# Patient Record
Sex: Female | Born: 1975
Health system: Southern US, Community
[De-identification: ages and names within clinical notes are randomized; demographics above are authoritative.]

## PROBLEM LIST (undated history)

## (undated) DIAGNOSIS — A048 Other specified bacterial intestinal infections: Secondary | ICD-10-CM

## (undated) HISTORY — PX: NO PAST SURGERIES: SHX2092

## (undated) HISTORY — DX: Other specified bacterial intestinal infections: A04.8

---

## 2014-06-02 ENCOUNTER — Ambulatory Visit: Payer: Medicaid Other | Attending: Family Medicine | Admitting: Family Medicine

## 2014-06-02 ENCOUNTER — Encounter: Payer: Self-pay | Admitting: Family Medicine

## 2014-06-02 VITALS — BP 133/84 | HR 68 | Temp 97.9°F | Resp 16 | Ht <= 58 in | Wt 117.0 lb

## 2014-06-02 DIAGNOSIS — B9681 Helicobacter pylori [H. pylori] as the cause of diseases classified elsewhere: Secondary | ICD-10-CM

## 2014-06-02 DIAGNOSIS — R1013 Epigastric pain: Secondary | ICD-10-CM | POA: Diagnosis not present

## 2014-06-02 DIAGNOSIS — Z113 Encounter for screening for infections with a predominantly sexual mode of transmission: Secondary | ICD-10-CM

## 2014-06-02 DIAGNOSIS — A048 Other specified bacterial intestinal infections: Secondary | ICD-10-CM

## 2014-06-02 DIAGNOSIS — Z0000 Encounter for general adult medical examination without abnormal findings: Secondary | ICD-10-CM

## 2014-06-02 DIAGNOSIS — G8929 Other chronic pain: Secondary | ICD-10-CM

## 2014-06-02 DIAGNOSIS — IMO0001 Reserved for inherently not codable concepts without codable children: Secondary | ICD-10-CM

## 2014-06-02 LAB — CBC
HEMATOCRIT: 38.3 % (ref 36.0–46.0)
Hemoglobin: 12.7 g/dL (ref 12.0–15.0)
MCH: 27 pg (ref 26.0–34.0)
MCHC: 33.2 g/dL (ref 30.0–36.0)
MCV: 81.5 fL (ref 78.0–100.0)
MPV: 11.1 fL (ref 8.6–12.4)
Platelets: 190 10*3/uL (ref 150–400)
RBC: 4.7 MIL/uL (ref 3.87–5.11)
RDW: 15.6 % — ABNORMAL HIGH (ref 11.5–15.5)
WBC: 6.3 10*3/uL (ref 4.0–10.5)

## 2014-06-02 LAB — POCT URINALYSIS DIPSTICK
BILIRUBIN UA: NEGATIVE
Glucose, UA: NEGATIVE
KETONES UA: NEGATIVE
Leukocytes, UA: NEGATIVE
Nitrite, UA: NEGATIVE
Protein, UA: NEGATIVE
Spec Grav, UA: 1.01
Urobilinogen, UA: 0.2
pH, UA: 6.5

## 2014-06-02 LAB — COMPLETE METABOLIC PANEL WITH GFR
ALT: 30 U/L (ref 0–35)
AST: 29 U/L (ref 0–37)
Albumin: 3.8 g/dL (ref 3.5–5.2)
Alkaline Phosphatase: 52 U/L (ref 39–117)
BILIRUBIN TOTAL: 0.3 mg/dL (ref 0.2–1.2)
BUN: 14 mg/dL (ref 6–23)
CALCIUM: 9.1 mg/dL (ref 8.4–10.5)
CO2: 27 meq/L (ref 19–32)
CREATININE: 0.63 mg/dL (ref 0.50–1.10)
Chloride: 100 mEq/L (ref 96–112)
GFR, Est African American: >89 mL/min
GFR, Est Non African American: >89 mL/min
GLUCOSE: 89 mg/dL (ref 70–99)
Potassium: 4.5 mEq/L (ref 3.5–5.3)
SODIUM: 134 meq/L — AB (ref 135–145)
TOTAL PROTEIN: 7.7 g/dL (ref 6.0–8.3)

## 2014-06-02 LAB — LIPID PANEL
CHOL/HDL RATIO: 2.4 ratio
Cholesterol: 149 mg/dL (ref 0–200)
HDL: 61 mg/dL (ref >39)
LDL CALC: 58 mg/dL (ref 0–99)
TRIGLYCERIDES: 150 mg/dL — AB (ref <150)
VLDL: 30 mg/dL (ref 0–40)

## 2014-06-02 MED ORDER — PANTOPRAZOLE SODIUM 40 MG PO TBEC: 40.0000 mg | DELAYED_RELEASE_TABLET | Freq: Every day | ORAL | Status: DC

## 2014-06-02 NOTE — Patient Instructions (Signed)
Hailey Chambers  Thank you for coming in today. It was a pleasure meeting you. I look forward to being your primary doctor.   1. GERD: Start protonix 30 minutes before supper.  You will be called with lab results, including test results for H. Pylori.    F/u in 1 month for GERD follow up and pap smear.   Dr. Armen PickupFunches    Gastroesophageal Reflux Disease, Adult Gastroesophageal reflux disease (GERD) happens when acid from your stomach goes into your food pipe (esophagus). The acid can cause a burning feeling in your chest. Over time, the acid can make small holes (ulcers) in your food pipe.  HOME CARE  Ask your doctor for advice about:  Losing weight.  Quitting smoking.  Alcohol use.  Avoid foods and drinks that make your problems worse. You may want to avoid:  Caffeine and alcohol.  Chocolate.  Mints.  Garlic and onions.  Spicy foods.  Citrus fruits, such as oranges, lemons, or limes.  Foods that contain tomato, such as sauce, chili, salsa, and pizza.  Fried and fatty foods.  Avoid lying down for 3 hours before you go to bed or before you take a nap.  Eat small meals often, instead of large meals.  Wear loose-fitting clothing. Do not wear anything tight around your waist.  Raise (elevate) the head of your bed 6 to 8 inches with wood blocks. Using extra pillows does not help.  Only take medicines as told by your doctor.  Do not take aspirin or ibuprofen. GET HELP RIGHT AWAY IF:   You have pain in your arms, neck, jaw, teeth, or back.  Your pain gets worse or changes.  You feel sick to your stomach (nauseous), throw up (vomit), or sweat (diaphoresis).  You feel short of breath, or you pass out (faint).  Your throw up is green, yellow, black, or looks like coffee grounds or blood.  Your poop (stool) is red, bloody, or black. MAKE SURE YOU:   Understand these instructions.  Will watch your condition.  Will get help right away if you are not doing  well or get worse. Document Released: 10/09/2007 Document Revised: 07/15/2011 Document Reviewed: 11/09/2010 [facility] Patient Information 2015 [place], [place]. This information is not intended to replace advice given to you by your health care provider. Make sure you discuss any questions you have with your health care provider.

## 2014-06-02 NOTE — Progress Notes (Signed)
Pt comes in with c/o epigastric intermit pain x 6 months Pt describes the pain as burning sensation radiating to left upper quad Denies n/v/d- Abdomen distended Pt from [place]- moved to [place] 3 mnths ago LMP- 05/27/14 No medical history reported In house Swahili interpretor present  Urine dipstick obtained

## 2014-06-02 NOTE — Progress Notes (Signed)
   Subjective:    Patient ID: Hailey Chambers, female    DOB: 06-01-1975, 39 y.o.   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 Epigastric pain: x 6 months. Worsened over past 3 months. [place] not [place] with what. Admits to burning pain. Sour taste in mouth. No CP, SOB, weight loss, N/V.   Soc Hx: non smoker Med Hx: GERD treated in the past  Surg Hx: negative  Review of Systems As per HPI     Objective:   Physical Exam BP 133/84 mmHg  Pulse 68  Temp(Src) 97.9 F (36.6 C) (Oral)  Resp 16  Ht 4\' 8"  (1.422 m)  Wt 117 lb (53.071 kg)  BMI 26.25 kg/m2  SpO2 98%  LMP 05/27/2014 General appearance: alert, cooperative and no distress Throat: lips, mucosa, and tongue normal; teeth and gums normal Neck: no adenopathy, supple, symmetrical, trachea midline and thyroid not enlarged, symmetric, no tenderness/mass/nodules Lungs: clear to auscultation bilaterally Heart: regular rate and rhythm, S1, S2 normal, no murmur, click, rub or gallop Abdomen: soft, non-tender; bowel sounds normal; no masses,  no organomegaly   UA: normal     Assessment & Plan:

## 2014-06-03 ENCOUNTER — Telehealth: Payer: Self-pay | Admitting: *Deleted

## 2014-06-03 DIAGNOSIS — A048 Other specified bacterial intestinal infections: Secondary | ICD-10-CM

## 2014-06-03 HISTORY — DX: Other specified bacterial intestinal infections: A04.8

## 2014-06-03 LAB — HIV ANTIBODY (ROUTINE TESTING W REFLEX): HIV 1&2 Ab, 4th Generation: NONREACTIVE

## 2014-06-03 LAB — H. PYLORI BREATH TEST: H. PYLORI BREATH TEST: DETECTED — AB

## 2014-06-03 MED ORDER — CLARITHROMYCIN 500 MG PO TABS: 500.0000 mg | ORAL_TABLET | Freq: Two times a day (BID) | ORAL | Status: DC

## 2014-06-03 MED ORDER — AMOXICILLIN 500 MG PO CAPS: 1000.0000 mg | ORAL_CAPSULE | Freq: Two times a day (BID) | ORAL | Status: DC

## 2014-06-03 NOTE — Assessment & Plan Note (Addendum)
Positive breath test  Will treat

## 2014-06-03 NOTE — Assessment & Plan Note (Signed)
1. GERD suspect, urea breath test done.  P: Start protonix 30 minutes before supper.  You will be called with lab results, including test results for H. Pylori.   Urea breath test positive, will add amoxicillin and clarithromycin x 10 days course = triple therapy

## 2014-06-03 NOTE — Telephone Encounter (Signed)
Used [employer] [place] [phone]#[phone]

## 2014-06-03 NOTE — Telephone Encounter (Signed)
-----   Message from Lora PaulaJosalyn C Funches, MD sent at 06/03/2014  4:05 PM EST ----- Urea breath test is positive, patient to start 10 days of antibiotic therapy with protonix sent to pharmacy amoxicillin 1000 mg BID and clarithromycin 500 mg BID.  Screening HIV negative. Normal CBC, CMP, lipids

## 2014-06-03 NOTE — Telephone Encounter (Signed)
Pt aware of lab results [facility]  Used [employer] [phone]#[id number]

## 2014-06-03 NOTE — Assessment & Plan Note (Signed)
Screening HIV negative  

## 2014-08-02 ENCOUNTER — Telehealth: Payer: Self-pay | Admitting: *Deleted

## 2014-08-02 NOTE — Telephone Encounter (Signed)
Immunization added Used [employer] [place]# [phone]  Left message with husband Stated will stop by tomorrow with wife

## 2014-08-02 NOTE — Telephone Encounter (Signed)
-----   Message from Dessa PhiJosalyn Funches, MD sent at 08/02/2014  8:44 AM EDT ----- Regarding: Immunizations  Jenna Ardoin, please add immunizations into patient's chart under immunizations.   Also, please call patient with reminders.  Due for second Hep B on 08/03/14 or later Due for Td on 11/12/14

## 2014-09-22 ENCOUNTER — Ambulatory Visit: Payer: Medicaid Other | Attending: Family Medicine | Admitting: Family Medicine

## 2014-09-22 ENCOUNTER — Encounter: Payer: Self-pay | Admitting: Family Medicine

## 2014-09-22 VITALS — BP 153/82 | HR 65 | Temp 97.6°F | Resp 16 | Ht <= 58 in | Wt 119.0 lb

## 2014-09-22 DIAGNOSIS — G8929 Other chronic pain: Secondary | ICD-10-CM

## 2014-09-22 DIAGNOSIS — N926 Irregular menstruation, unspecified: Secondary | ICD-10-CM | POA: Diagnosis not present

## 2014-09-22 DIAGNOSIS — B9681 Helicobacter pylori [H. pylori] as the cause of diseases classified elsewhere: Secondary | ICD-10-CM | POA: Insufficient documentation

## 2014-09-22 DIAGNOSIS — R030 Elevated blood-pressure reading, without diagnosis of hypertension: Secondary | ICD-10-CM | POA: Diagnosis not present

## 2014-09-22 DIAGNOSIS — R1013 Epigastric pain: Secondary | ICD-10-CM

## 2014-09-22 DIAGNOSIS — IMO0001 Reserved for inherently not codable concepts without codable children: Secondary | ICD-10-CM

## 2014-09-22 DIAGNOSIS — Z3202 Encounter for pregnancy test, result negative: Secondary | ICD-10-CM | POA: Insufficient documentation

## 2014-09-22 LAB — POCT URINE PREGNANCY: PREG TEST UR: NEGATIVE

## 2014-09-22 MED ORDER — PANTOPRAZOLE SODIUM 40 MG PO TBEC: 40.0000 mg | DELAYED_RELEASE_TABLET | Freq: Every day | ORAL | Status: DC

## 2014-09-22 NOTE — Assessment & Plan Note (Signed)
Missed period: U preg negative today

## 2014-09-22 NOTE — Progress Notes (Signed)
   Subjective:    Patient ID: Hailey Chambers, female    DOB: Mar 29, 1976, 39 y.o.   MRN: [id number] CC: f/u epigastric pain du to H pylori  HPI  1. Epigastric pain related to H. Pylori:  Pain has improved. Now intermittent only. No nausea or emesis. Patient has completed triple therapy. No longer taking PPI.   2. Missed period: missed period x 1 month. No history of skipped menses. No sweats or weight loss.   3. Elevated BP:  Noted on today's exam. No hx of HTN.   Soc Hx: non smoker  Review of Systems  Constitutional: Negative for unexpected weight change.  Eyes: Negative for visual disturbance.  Respiratory: Negative for shortness of breath.   Cardiovascular: Negative for chest pain.  Neurological: Negative for headaches.      Objective:   Physical Exam BP 153/82 mmHg  Pulse 65  Temp(Src) 97.6 F (36.4 C) (Oral)  Resp 16  Ht 4' 7.5" (1.41 m)  Wt 119 lb (53.978 kg)  BMI 27.15 kg/m2  SpO2 100%  LMP 08/01/2014  Wt Readings from Last 3 Encounters:  09/22/14 119 lb (53.978 kg)  06/02/14 117 lb (53.071 kg)    BP Readings from Last 3 Encounters:  09/22/14 159/82  06/02/14 133/84  General appearance: alert, cooperative and no distress Lungs: clear to auscultation bilaterally Heart: regular rate and rhythm, S1, S2 normal, no murmur, click, rub or gallop  U preg: negative      Assessment & Plan:

## 2014-09-22 NOTE — Progress Notes (Signed)
F/U Stomach problems Stated feeling better  No NVD

## 2014-09-22 NOTE — Assessment & Plan Note (Signed)
Elevated BP today: Start DASH diet

## 2014-09-22 NOTE — Patient Instructions (Signed)
Hailey Chambers,  1. Epigastric pain: H pylori treated  protonix as needed   2. Missed period: U preg negative today   3. Elevated BP today: Start DASH diet    F/u in 3 weeks for RN BP reading if elevated at this time medication will be started  F/u with me in 3 months for elevated BP and f/u menstrual periods   Dr. Armen PickupFunches      DASH Eating Plan DASH stands for "Dietary Approaches to Stop Hypertension." The DASH eating plan is a healthy eating plan that has been shown to reduce high blood pressure (hypertension). Additional health benefits may include reducing the risk of type 2 diabetes mellitus, heart disease, and stroke. The DASH eating plan may also help with weight loss. WHAT DO I NEED TO KNOW ABOUT THE DASH EATING PLAN? For the DASH eating plan, you will follow these general guidelines:  Choose foods with a percent daily value for sodium of less than 5% (as listed on the food label).  Use salt-free seasonings or herbs instead of table salt or sea salt.  Check with your health care provider or pharmacist before using salt substitutes.  Eat lower-sodium products, often labeled as "lower sodium" or "no salt added."  Eat fresh foods.  Eat more vegetables, fruits, and low-fat dairy products.  Choose whole grains. Look for the word "whole" as the first word in the ingredient list.  Choose fish and skinless chicken or [place] more often than red meat. Limit fish, poultry, and meat to 6 oz (170 g) each day.  Limit sweets, desserts, sugars, and sugary drinks.  Choose heart-healthy fats.  Limit cheese to 1 oz (28 g) per day.  Eat more home-cooked food and less restaurant, buffet, and fast food.  Limit fried foods.  Cook foods using methods other than frying.  Limit canned vegetables. If you do use them, rinse them well to decrease the sodium.  When eating at a restaurant, ask that your food be prepared with less salt, or no salt if possible. WHAT FOODS CAN I EAT? Seek  help from a dietitian for individual calorie needs. Grains Whole grain or whole wheat bread. Brown rice. Whole grain or whole wheat pasta. Quinoa, bulgur, and whole grain cereals. Low-sodium cereals. Corn or whole wheat flour tortillas. Whole grain cornbread. Whole grain crackers. Low-sodium crackers. Vegetables Fresh or frozen vegetables (raw, steamed, roasted, or grilled). Low-sodium or reduced-sodium tomato and vegetable juices. Low-sodium or reduced-sodium tomato sauce and paste. Low-sodium or reduced-sodium canned vegetables.  Fruits All fresh, canned (in natural juice), or frozen fruits. Meat and Other Protein Products Ground beef (85% or leaner), grass-fed beef, or beef trimmed of fat. Skinless chicken or [place]. Ground chicken or [place]. Pork trimmed of fat. All fish and seafood. Eggs. Dried beans, peas, or lentils. Unsalted nuts and seeds. Unsalted canned beans. Dairy Low-fat dairy products, such as skim or 1% milk, 2% or reduced-fat cheeses, low-fat ricotta or cottage cheese, or plain low-fat yogurt. Low-sodium or reduced-sodium cheeses. Fats and Oils Tub margarines without trans fats. Light or reduced-fat mayonnaise and salad dressings (reduced sodium). Avocado. Safflower, olive, or canola oils. Natural peanut or almond butter. Other Unsalted popcorn and pretzels. The items listed above may not be a complete list of recommended foods or beverages. Contact your dietitian for more options. WHAT FOODS ARE NOT RECOMMENDED? Grains White bread. White pasta. White rice. Refined cornbread. Bagels and croissants. Crackers that contain trans fat. Vegetables Creamed or fried vegetables. Vegetables in a cheese sauce.  Regular canned vegetables. Regular canned tomato sauce and paste. Regular tomato and vegetable juices. Fruits Dried fruits. Canned fruit in light or heavy syrup. Fruit juice. Meat and Other Protein Products Fatty cuts of meat. Ribs, chicken wings, bacon, sausage, bologna, salami,  chitterlings, fatback, hot dogs, bratwurst, and packaged luncheon meats. Salted nuts and seeds. Canned beans with salt. Dairy Whole or 2% milk, cream, half-and-half, and cream cheese. Whole-fat or sweetened yogurt. Full-fat cheeses or blue cheese. Nondairy creamers and whipped toppings. Processed cheese, cheese spreads, or cheese curds. Condiments Onion and garlic salt, seasoned salt, table salt, and sea salt. Canned and packaged gravies. Worcestershire sauce. Tartar sauce. Barbecue sauce. Teriyaki sauce. Soy sauce, including reduced sodium. Steak sauce. Fish sauce. Oyster sauce. Cocktail sauce. Horseradish. Ketchup and mustard. Meat flavorings and tenderizers. Bouillon cubes. Hot sauce. Tabasco sauce. Marinades. Taco seasonings. Relishes. Fats and Oils Butter, stick margarine, lard, shortening, ghee, and bacon fat. Coconut, palm kernel, or palm oils. Regular salad dressings. Other Pickles and olives. Salted popcorn and pretzels. The items listed above may not be a complete list of foods and beverages to avoid. Contact your dietitian for more information. WHERE CAN I FIND MORE INFORMATION? National Heart, Lung, and Blood Institute: CableProm[employer].itwww.nhlbi.nih.gov/health/health-topics/topics/dash/ Document Released: 04/11/2011 Document Revised: 09/06/2013 Document Reviewed: 02/24/2013 [facility] Patient Information 2015 [place]Care, [place]. This information is not intended to replace advice given to you by your health care provider. Make sure you discuss any questions you have with your health care provider.

## 2014-09-22 NOTE — Assessment & Plan Note (Signed)
1. Epigastric pain: H pylori treated  protonix as needed

## 2014-10-19 ENCOUNTER — Ambulatory Visit: Payer: Medicaid Other | Attending: Family Medicine | Admitting: *Deleted

## 2014-10-19 VITALS — BP 126/72 | HR 71 | Temp 98.1°F | Resp 22 | Ht <= 58 in | Wt 121.6 lb

## 2014-10-19 DIAGNOSIS — R030 Elevated blood-pressure reading, without diagnosis of hypertension: Secondary | ICD-10-CM

## 2014-10-19 DIAGNOSIS — IMO0001 Reserved for inherently not codable concepts without codable children: Secondary | ICD-10-CM

## 2014-10-19 NOTE — Progress Notes (Signed)
Spoke with patient via Sport and exercise psychologist, Hailey Chambers Patient presents for BP check after elevated BP at last OV Med list reviewed; states taking protonix as directed Discussed need for low sodium diet and using Mrs. Dash as alternative to salt Patient walking 10 minutes daily. Encouraged to  Increase walking to 30 minutes per day Patient denies headaches, blurred vision, SHOB, chest pain   Filed Vitals:   10/19/14 1125  BP: 126/72  Pulse: 71  Temp: 98.1 F (36.7 C)  Resp: 22     Patient aware that she is to f/u with PCP 3 months from last visit (Due 12/23/14)  Patient given literature on DASH Eating Plan

## 2014-10-19 NOTE — Patient Instructions (Signed)
DASH Eating Plan °DASH stands for "Dietary Approaches to Stop Hypertension." The DASH eating plan is a healthy eating plan that has been shown to reduce high blood pressure (hypertension). Additional health benefits may include reducing the risk of type 2 diabetes mellitus, heart disease, and stroke. The DASH eating plan may also help with weight loss. °WHAT DO I NEED TO KNOW ABOUT THE DASH EATING PLAN? °For the DASH eating plan, you will follow these general guidelines: °· Choose foods with a percent daily value for sodium of less than 5% (as listed on the food label). °· Use salt-free seasonings or herbs instead of table salt or sea salt. °· Check with your health care provider or pharmacist before using salt substitutes. °· Eat lower-sodium products, often labeled as "lower sodium" or "no salt added." °· Eat fresh foods. °· Eat more vegetables, fruits, and low-fat dairy products. °· Choose whole grains. Look for the word "whole" as the first word in the ingredient list. °· Choose fish and skinless chicken or turkey more often than red meat. Limit fish, poultry, and meat to 6 oz (170 g) each day. °· Limit sweets, desserts, sugars, and sugary drinks. °· Choose heart-healthy fats. °· Limit cheese to 1 oz (28 g) per day. °· Eat more home-cooked food and less restaurant, buffet, and fast food. °· Limit fried foods. °· Cook foods using methods other than frying. °· Limit canned vegetables. If you do use them, rinse them well to decrease the sodium. °· When eating at a restaurant, ask that your food be prepared with less salt, or no salt if possible. °WHAT FOODS CAN I EAT? °Seek help from a dietitian for individual calorie needs. °Grains °Whole grain or whole wheat bread. Brown rice. Whole grain or whole wheat pasta. Quinoa, bulgur, and whole grain cereals. Low-sodium cereals. Corn or whole wheat flour tortillas. Whole grain cornbread. Whole grain crackers. Low-sodium crackers. °Vegetables °Fresh or frozen vegetables  (raw, steamed, roasted, or grilled). Low-sodium or reduced-sodium tomato and vegetable juices. Low-sodium or reduced-sodium tomato sauce and paste. Low-sodium or reduced-sodium canned vegetables.  °Fruits °All fresh, canned (in natural juice), or frozen fruits. °Meat and Other Protein Products °Ground beef (85% or leaner), grass-fed beef, or beef trimmed of fat. Skinless chicken or turkey. Ground chicken or turkey. Pork trimmed of fat. All fish and seafood. Eggs. Dried beans, peas, or lentils. Unsalted nuts and seeds. Unsalted canned beans. °Dairy °Low-fat dairy products, such as skim or 1% milk, 2% or reduced-fat cheeses, low-fat ricotta or cottage cheese, or plain low-fat yogurt. Low-sodium or reduced-sodium cheeses. °Fats and Oils °Tub margarines without trans fats. Light or reduced-fat mayonnaise and salad dressings (reduced sodium). Avocado. Safflower, olive, or canola oils. Natural peanut or almond butter. °Other °Unsalted popcorn and pretzels. °The items listed above may not be a complete list of recommended foods or beverages. Contact your dietitian for more options. °WHAT FOODS ARE NOT RECOMMENDED? °Grains °White bread. White pasta. White rice. Refined cornbread. Bagels and croissants. Crackers that contain trans fat. °Vegetables °Creamed or fried vegetables. Vegetables in a cheese sauce. Regular canned vegetables. Regular canned tomato sauce and paste. Regular tomato and vegetable juices. °Fruits °Dried fruits. Canned fruit in light or heavy syrup. Fruit juice. °Meat and Other Protein Products °Fatty cuts of meat. Ribs, chicken wings, bacon, sausage, bologna, salami, chitterlings, fatback, hot dogs, bratwurst, and packaged luncheon meats. Salted nuts and seeds. Canned beans with salt. °Dairy °Whole or 2% milk, cream, half-and-half, and cream cheese. Whole-fat or sweetened yogurt. Full-fat   cheeses or blue cheese. Nondairy creamers and whipped toppings. Processed cheese, cheese spreads, or cheese  curds. °Condiments °Onion and garlic salt, seasoned salt, table salt, and sea salt. Canned and packaged gravies. Worcestershire sauce. Tartar sauce. Barbecue sauce. Teriyaki sauce. Soy sauce, including reduced sodium. Steak sauce. Fish sauce. Oyster sauce. Cocktail sauce. Horseradish. Ketchup and mustard. Meat flavorings and tenderizers. Bouillon cubes. Hot sauce. Tabasco sauce. Marinades. Taco seasonings. Relishes. °Fats and Oils °Butter, stick margarine, lard, shortening, ghee, and bacon fat. Coconut, palm kernel, or palm oils. Regular salad dressings. °Other °Pickles and olives. Salted popcorn and pretzels. °The items listed above may not be a complete list of foods and beverages to avoid. Contact your dietitian for more information. °WHERE CAN I FIND MORE INFORMATION? °National Heart, Lung, and Blood Institute: www.nhlbi.nih.gov/health/health-topics/topics/dash/ °Document Released: 04/11/2011 Document Revised: 09/06/2013 Document Reviewed: 02/24/2013 °ExitCare® Patient Information ©2015 ExitCare, LLC. This information is not intended to replace advice given to you by your health care provider. Make sure you discuss any questions you have with your health care provider. ° °

## 2014-12-29 ENCOUNTER — Ambulatory Visit: Payer: Medicaid Other | Attending: Family Medicine | Admitting: Family Medicine

## 2014-12-29 ENCOUNTER — Encounter: Payer: Self-pay | Admitting: Family Medicine

## 2014-12-29 ENCOUNTER — Other Ambulatory Visit (HOSPITAL_COMMUNITY)
Admission: RE | Admit: 2014-12-29 | Discharge: 2014-12-29 | Disposition: A | Discharge status: Home / Self Care | Payer: Medicaid Other | Source: Ambulatory Visit | Attending: Family Medicine | Admitting: Family Medicine

## 2014-12-29 VITALS — BP 138/77 | HR 71 | Temp 98.0°F | Resp 16 | Ht <= 58 in | Wt 124.0 lb

## 2014-12-29 DIAGNOSIS — Z113 Encounter for screening for infections with a predominantly sexual mode of transmission: Secondary | ICD-10-CM | POA: Insufficient documentation

## 2014-12-29 DIAGNOSIS — N898 Other specified noninflammatory disorders of vagina: Secondary | ICD-10-CM | POA: Diagnosis not present

## 2014-12-29 DIAGNOSIS — Z01419 Encounter for gynecological examination (general) (routine) without abnormal findings: Secondary | ICD-10-CM | POA: Diagnosis not present

## 2014-12-29 DIAGNOSIS — Z1151 Encounter for screening for human papillomavirus (HPV): Secondary | ICD-10-CM | POA: Insufficient documentation

## 2014-12-29 DIAGNOSIS — N926 Irregular menstruation, unspecified: Secondary | ICD-10-CM | POA: Insufficient documentation

## 2014-12-29 DIAGNOSIS — N760 Acute vaginitis: Secondary | ICD-10-CM | POA: Insufficient documentation

## 2014-12-29 LAB — CBC
HEMATOCRIT: 37.8 % (ref 36.0–46.0)
Hemoglobin: 13 g/dL (ref 12.0–15.0)
MCH: 27.1 pg (ref 26.0–34.0)
MCHC: 34.4 g/dL (ref 30.0–36.0)
MCV: 78.8 fL (ref 78.0–100.0)
MPV: 11.4 fL (ref 8.6–12.4)
PLATELETS: 186 10*3/uL (ref 150–400)
RBC: 4.8 MIL/uL (ref 3.87–5.11)
RDW: 14.7 % (ref 11.5–15.5)
WBC: 5.7 10*3/uL (ref 4.0–10.5)

## 2014-12-29 LAB — POCT GLYCOSYLATED HEMOGLOBIN (HGB A1C): Hemoglobin A1C: 5.6

## 2014-12-29 LAB — POCT URINE PREGNANCY: Preg Test, Ur: NEGATIVE

## 2014-12-29 LAB — TSH: TSH: 1.096 u[IU]/mL (ref 0.350–4.500)

## 2014-12-29 NOTE — Progress Notes (Signed)
   Subjective:    Patient ID: Hailey Chambers, female    DOB: 1975/12/17, 39 y.o.   MRN: [id number] CC: menstrual irregularity  HPI 39 yo F presents with interpreter for the following:  1. Irregular menses: missed period in July. LMP 12/15/2014. Last 6 days, heavy bleeding. No bleeding now. No abdominal pain. Patient with no history of abnormal paps.   Review of Systems  Constitutional: Negative for fever and chills.  Gastrointestinal: Negative for abdominal pain and blood in stool.  Genitourinary: Positive for vaginal discharge and menstrual problem. Negative for vaginal bleeding and vaginal pain.  Allergic/Immunologic: Negative for immunocompromised state.  Psychiatric/Behavioral: Negative for suicidal ideas and dysphoric mood.       Objective:   Physical Exam  Constitutional: She is oriented to person, place, and time. She appears well-developed and well-nourished. No distress.  HENT:  Head: Normocephalic and atraumatic.  Pulmonary/Chest: Effort normal.  Abdominal: Soft. She exhibits no distension. There is no tenderness.  Genitourinary: Uterus normal. Guaiac stool: scant white vaginal discharge  Pelvic exam was performed with patient prone. There is no rash, tenderness or lesion on the right labia. There is no rash, tenderness or lesion on the left labia. Cervix exhibits discharge (scant white vaginal discharge ). Cervix exhibits no motion tenderness and no friability. Right adnexum displays no mass, no tenderness and no fullness. Left adnexum displays no mass, no tenderness and no fullness. Vaginal discharge found.  Musculoskeletal: She exhibits no edema.  Lymphadenopathy:       Right: No inguinal adenopathy present.       Left: No inguinal adenopathy present.  Neurological: She is alert and oriented to person, place, and time.  Skin: Skin is warm and dry. No rash noted.  Psychiatric: She has a normal mood and affect.  BP 138/77 mmHg  Pulse 71  Temp(Src) 98 F (36.7 C) (Oral)   Resp 16  Ht 4' 7.5" (1.41 m)  Wt 124 lb (56.246 kg)  BMI 28.29 kg/m2  SpO2 99%  LMP 12/15/2014          Assessment & Plan:

## 2014-12-29 NOTE — Assessment & Plan Note (Signed)
1. Irregular menses Negative U preg today Normal pelvic exam today Checking uterus and ovaries with ultrasound Checking blood sugar, CBC and thyroid which could affect menses  F/u in 3-4 weeks to review ultrasound

## 2014-12-29 NOTE — Progress Notes (Signed)
Complaining of irregular menses. Had cycle on June and august  August cycle was irregular. Sexually active, no birth control    Used language resource Inter[employer]

## 2014-12-29 NOTE — Patient Instructions (Addendum)
Ms. Hailey Chambers,  Thank you for coming in today  1. Irregular menses Negative U preg today Normal pelvic exam today Checking uterus and ovaries with ultrasound Checking blood sugar, CBC and thyroid which could affect menses  F/u in 3-4 weeks to review ultrasound  Dr. Armen Pickup

## 2014-12-30 LAB — CERVICOVAGINAL ANCILLARY ONLY
Chlamydia: NEGATIVE
NEISSERIA GONORRHEA: NEGATIVE
Wet Prep (BD Affirm): NEGATIVE

## 2014-12-30 LAB — CYTOLOGY - PAP

## 2015-01-04 ENCOUNTER — Ambulatory Visit (HOSPITAL_COMMUNITY)
Admission: RE | Admit: 2015-01-04 | Discharge: 2015-01-04 | Disposition: A | Discharge status: Home / Self Care | Payer: Medicaid Other | Source: Ambulatory Visit | Attending: Family Medicine | Admitting: Family Medicine

## 2015-01-04 ENCOUNTER — Telehealth: Payer: Self-pay | Admitting: *Deleted

## 2015-01-04 DIAGNOSIS — N926 Irregular menstruation, unspecified: Secondary | ICD-10-CM | POA: Insufficient documentation

## 2015-01-04 NOTE — Telephone Encounter (Signed)
Used [employer] [place] [phone]  Unable to contact Pt Mail box is full

## 2015-01-04 NOTE — Telephone Encounter (Signed)
-----   Message from Dessa Phi, MD sent at 12/30/2014  3:52 PM EDT ----- Negative pap, repeat in 3 years

## 2015-01-04 NOTE — Telephone Encounter (Signed)
-----   Message from Dessa Phi, MD sent at 12/30/2014 10:49 AM EDT ----- Normal CBC, TSH, wet prep, GC, chlam

## 2015-01-10 ENCOUNTER — Telehealth: Payer: Self-pay | Admitting: *Deleted

## 2015-01-10 NOTE — Telephone Encounter (Signed)
-----   Message from Dessa Phi, MD sent at 01/05/2015 10:04 AM EDT ----- Normal pelvic and TVUS

## 2015-01-10 NOTE — Telephone Encounter (Signed)
Used [employer] [place] # [id number]  Left message with husband, normal labs  Requesting copy of results Advised per HIPPA restriction need to sign medical record  form copy of labs results

## 2015-02-23 ENCOUNTER — Ambulatory Visit: Payer: Medicaid Other | Admitting: Family Medicine

## 2015-02-24 ENCOUNTER — Ambulatory Visit: Payer: Medicaid Other | Attending: Family Medicine | Admitting: Family Medicine

## 2015-02-24 ENCOUNTER — Encounter: Payer: Self-pay | Admitting: Family Medicine

## 2015-02-24 VITALS — BP 147/76 | HR 73 | Temp 98.2°F | Resp 16 | Ht <= 58 in | Wt 125.0 lb

## 2015-02-24 DIAGNOSIS — Z23 Encounter for immunization: Secondary | ICD-10-CM | POA: Insufficient documentation

## 2015-02-24 DIAGNOSIS — N912 Amenorrhea, unspecified: Secondary | ICD-10-CM | POA: Diagnosis present

## 2015-02-24 DIAGNOSIS — N926 Irregular menstruation, unspecified: Secondary | ICD-10-CM | POA: Diagnosis not present

## 2015-02-24 LAB — POCT URINE PREGNANCY: Preg Test, Ur: NEGATIVE

## 2015-02-24 NOTE — Progress Notes (Signed)
Used [employer] interpreter [place] [phone]#[id number] C/C no menses for two month  LMP 12/15/2015 No pain today

## 2015-02-24 NOTE — Patient Instructions (Addendum)
Hailey Chambers was seen today for amenorrhea.  Diagnoses and all orders for this visit:  Irregular menstrual cycle -     POCT urine pregnancy -     Ambulatory referral to Gynecology -     hCG, quantitative, pregnancy   F/u in 2 months with me for amenorrhea  Dr. Armen PickupFunches   Secondary Amenorrhea Secondary amenorrhea is the stopping of menstrual flow for 3-6 months in a female who has previously had periods. There are many possible causes. Most of these causes are not serious. Usually, treating the underlying problem causing the loss of menses will return your periods to normal. CAUSES  Some common and uncommon causes of not menstruating include:  Malnutrition.  Low blood sugar (hypoglycemia).  Polycystic ovary disease.  Stress or fear.  Breastfeeding.  Hormone imbalance.  Ovarian failure.  Medicines.  Extreme obesity.  Cystic fibrosis.  Low body weight or drastic weight reduction from any cause.  Early menopause.  Removal of ovaries or uterus.  Contraceptives.  Illness.  Long-term (chronic) illnesses.  Cushing syndrome.  Thyroid problems.  Birth control pills, patches, or vaginal rings for birth control. RISK FACTORS You may be at greater risk of secondary amenorrhea if:  You have a family history of this condition.  You have an eating disorder.  You do athletic training. DIAGNOSIS  A diagnosis is made by your health care provider taking a medical history and doing a physical exam. This will include a pelvic exam to check for problems with your reproductive organs. Pregnancy must be ruled out. Often, numerous blood tests are done to measure different hormones in the body. Urine testing may be done. Specialized exams (ultrasound, CT scan, MRI, or hysteroscopy) may have to be done as well as measuring the body mass index (BMI). TREATMENT  Treatment depends on the cause of the amenorrhea. If an eating disorder is present, this can be treated with an adequate diet  and therapy. Chronic illnesses may improve with treatment of the illness. Amenorrhea may be corrected with medicines, lifestyle changes, or surgery. If the amenorrhea cannot be corrected, it is sometimes possible to create a false menstruation with medicines. HOME CARE INSTRUCTIONS  Maintain a healthy diet.  Manage weight problems.  Exercise regularly but not excessively.  Get adequate sleep.  Manage stress.  Be aware of changes in your menstrual cycle. Keep a record of when your periods occur. Note the date your period starts, how long it lasts, and any problems. SEEK MEDICAL CARE IF: Your symptoms do not get better with treatment.   This information is not intended to replace advice given to you by your health care provider. Make sure you discuss any questions you have with your health care provider.   Document Released: 06/03/2006 Document Revised: 05/13/2014 Document Reviewed: 10/08/2012 Elsevier Interactive Patient Education [employer]2016 Elsevier Inc.

## 2015-02-24 NOTE — Progress Notes (Signed)
Patient ID: Hailey Chambers, female   DOB: 1975/05/08, 39 y.o.   MRN: [id number]   Subjective:  Patient ID: Hailey Chambers, female    DOB: 1975/05/08  Age: 39 y.o. MRN: [id number]  CC: Amenorrhea   HPI Hailey AmassKayambi Moosman presents for    1. Amenorrhea: no period for 2 months. Negative U preg in office today. Has hx of irregular menses. Missed period in July. LMP 12/15/2014. Normal pelvic exam. Normal pelvic and TVUS. Normal thyroid test, CBC and blood sugar testing.  She endorses nausea. No emesis. No abdominal pain. She feels she is pregnant. She does not believe the urine test. She would like a blood test. She is not trying to conceive but would be open to it.   Social History  Substance Use Topics  . Smoking status: Never Smoker   . Smokeless tobacco: Never Used  . Alcohol Use: No    Outpatient Prescriptions Prior to Visit  Medication Sig Dispense Refill  . pantoprazole (PROTONIX) 40 MG tablet Take 1 tablet (40 mg total) by mouth daily before supper. (Patient not taking: Reported on 02/24/2015) 30 tablet 3   No facility-administered medications prior to visit.    ROS Review of Systems  Constitutional: Negative for fever and chills.  Eyes: Negative for visual disturbance.  Respiratory: Negative for shortness of breath.   Cardiovascular: Negative for chest pain.  Gastrointestinal: Positive for nausea. Negative for abdominal pain and blood in stool.  Genitourinary: Positive for menstrual problem.  Musculoskeletal: Negative for back pain and arthralgias.  Skin: Negative for rash.  Allergic/Immunologic: Negative for immunocompromised state.  Hematological: Negative for adenopathy. Does not bruise/bleed easily.  Psychiatric/Behavioral: Negative for suicidal ideas and dysphoric mood.    Objective:  BP 147/76 mmHg  Pulse 73  Temp(Src) 98.2 F (36.8 C) (Oral)  Resp 16  Ht 4' 7.5" (1.41 m)  Wt 125 lb (56.7 kg)  BMI 28.52 kg/m2  SpO2 100%  LMP 12/15/2014  BP/Weight  02/24/2015 12/29/2014 10/19/2014  Systolic BP 147 138 126  Diastolic BP 76 77 72  Wt. (Lbs) 125 124 121.6  BMI 28.52 28.29 27.51   Physical Exam  Constitutional: She is oriented to person, place, and time. She appears well-developed and well-nourished. No distress.  HENT:  Head: Normocephalic and atraumatic.  Cardiovascular: Normal rate, regular rhythm, normal heart sounds and intact distal pulses.   Pulmonary/Chest: Effort normal and breath sounds normal.  Musculoskeletal: She exhibits no edema.  Neurological: She is alert and oriented to person, place, and time.  Skin: Skin is warm and dry. No rash noted.  Psychiatric: She has a normal mood and affect.   U preg: negative   Assessment & Plan:   Problem List Items Addressed This Visit    Irregular menstrual cycle - Primary   Relevant Orders   POCT urine pregnancy (Completed)   Ambulatory referral to Gynecology   hCG, quantitative, pregnancy   Flu Vaccine QUAD 36+ mos IM (Completed)      No orders of the defined types were placed in this encounter.    Follow-up: No Follow-up on file.   Dessa PhiJosalyn Eunice Winecoff MD

## 2015-02-25 LAB — HCG, QUANTITATIVE, PREGNANCY

## 2015-03-01 ENCOUNTER — Encounter: Payer: Self-pay | Admitting: Obstetrics & Gynecology

## 2015-03-24 ENCOUNTER — Encounter: Payer: Medicaid Other | Admitting: Obstetrics & Gynecology

## 2015-04-19 ENCOUNTER — Ambulatory Visit (INDEPENDENT_AMBULATORY_CARE_PROVIDER_SITE_OTHER): Payer: Medicaid Other | Admitting: Family

## 2015-04-19 ENCOUNTER — Encounter: Payer: Self-pay | Admitting: Obstetrics & Gynecology

## 2015-04-19 ENCOUNTER — Encounter: Payer: Self-pay | Admitting: Family

## 2015-04-19 ENCOUNTER — Other Ambulatory Visit (HOSPITAL_COMMUNITY)
Admission: RE | Admit: 2015-04-19 | Discharge: 2015-04-19 | Disposition: A | Discharge status: Home / Self Care | Payer: Medicaid Other | Source: Ambulatory Visit | Attending: Family | Admitting: Family

## 2015-04-19 VITALS — BP 140/81 | HR 69 | Temp 98.5°F | Ht 59.0 in | Wt 120.9 lb

## 2015-04-19 DIAGNOSIS — Z3201 Encounter for pregnancy test, result positive: Secondary | ICD-10-CM | POA: Diagnosis present

## 2015-04-19 DIAGNOSIS — Z113 Encounter for screening for infections with a predominantly sexual mode of transmission: Secondary | ICD-10-CM | POA: Diagnosis present

## 2015-04-19 DIAGNOSIS — Z3490 Encounter for supervision of normal pregnancy, unspecified, unspecified trimester: Secondary | ICD-10-CM

## 2015-04-19 NOTE — Progress Notes (Signed)
Interpreter Hailey Chambers Pt reports not having period since September. Pt states that she went to ED in October where her pregnancy test and beta quant resulted in negative.  Pt pregnancy test today resulted positive.  Pregnancy unplanned.  Pt states that she wants to start prenatal care at the Clinics.  Obtained initial lab work today.  US scheduled due to pt unsure of LMP.  US scheduled for December 22nd @ 0800.  Pending First Trimester Screen due to awaiting US results.  Pt informed me that she is concerned about having her appts because her job is strict.  I advised pt to request her FMLA for intermittent leave so that she should be covered when she has ER visit, US, and prenatal visits.  I wrote on piece of paper what the pt should request due to pt not speaking [place].  Pt stated "thank you" with no further questions.

## 2015-04-20 LAB — PRENATAL PROFILE (SOLSTAS)
ANTIBODY SCREEN: NEGATIVE
BASOS ABS: 0 10*3/uL (ref 0.0–0.1)
BASOS PCT: 0 % (ref 0–1)
EOS PCT: 1 % (ref 0–5)
Eosinophils Absolute: 0.1 10*3/uL (ref 0.0–0.7)
HEMATOCRIT: 35.8 % — AB (ref 36.0–46.0)
HEMOGLOBIN: 11.9 g/dL — AB (ref 12.0–15.0)
HIV: NONREACTIVE
Hepatitis B Surface Ag: NEGATIVE
LYMPHS PCT: 28 % (ref 12–46)
Lymphs Abs: 1.9 10*3/uL (ref 0.7–4.0)
MCH: 27.4 pg (ref 26.0–34.0)
MCHC: 33.2 g/dL (ref 30.0–36.0)
MCV: 82.5 fL (ref 78.0–100.0)
MPV: 12 fL (ref 8.6–12.4)
Monocytes Absolute: 0.5 10*3/uL (ref 0.1–1.0)
Monocytes Relative: 8 % (ref 3–12)
Neutro Abs: 4.2 10*3/uL (ref 1.7–7.7)
Neutrophils Relative %: 63 % (ref 43–77)
Platelets: 195 10*3/uL (ref 150–400)
RBC: 4.34 MIL/uL (ref 3.87–5.11)
RDW: 14.6 % (ref 11.5–15.5)
RH TYPE: POSITIVE
Rubella: 4.91 Index — ABNORMAL HIGH (ref <0.90)
WBC: 6.7 10*3/uL (ref 4.0–10.5)

## 2015-04-20 LAB — POCT PREGNANCY, URINE: PREG TEST UR: POSITIVE — AB

## 2015-04-20 LAB — GC/CHLAMYDIA PROBE AMP (~~LOC~~) NOT AT ARMC
Chlamydia: NEGATIVE
Neisseria Gonorrhea: NEGATIVE

## 2015-04-21 LAB — PRESCRIPTION MONITORING PROFILE (19 PANEL)
AMPHETAMINE/METH: NEGATIVE ng/mL
BUPRENORPHINE, URINE: NEGATIVE ng/mL
Barbiturate Screen, Urine: NEGATIVE ng/mL
Benzodiazepine Screen, Urine: NEGATIVE ng/mL
CANNABINOID SCRN UR: NEGATIVE ng/mL
COCAINE METABOLITES: NEGATIVE ng/mL
CREATININE, URINE: 146.05 mg/dL (ref >20.0)
Carisoprodol, Urine: NEGATIVE ng/mL
ECSTASY: NEGATIVE ng/mL
FENTANYL URINE: NEGATIVE ng/mL
MEPERIDINE UR: NEGATIVE ng/mL
METHADONE SCREEN, URINE: NEGATIVE ng/mL
METHAQUALONE SCREEN (URINE): NEGATIVE ng/mL
Nitrites, Initial: NEGATIVE ug/mL
OXYCODONE SCRN UR: NEGATIVE ng/mL
Opiate Screen, Urine: NEGATIVE ng/mL
PH URINE, INITIAL: 6.5 pH (ref 4.5–8.9)
PHENCYCLIDINE, UR: NEGATIVE ng/mL
Propoxyphene: NEGATIVE ng/mL
TAPENTADOLUR: NEGATIVE ng/mL
Tramadol Scrn, Ur: NEGATIVE ng/mL
Zolpidem, Urine: NEGATIVE ng/mL

## 2015-04-21 LAB — HEMOGLOBINOPATHY EVALUATION
HEMOGLOBIN OTHER: 0 %
HGB A2 QUANT: 3.2 % (ref 2.2–3.2)
HGB F QUANT: 0 % (ref 0.0–2.0)
HGB S QUANTITAION: 38.8 % — AB
Hgb A: 58 % — ABNORMAL LOW (ref 96.8–97.8)

## 2015-04-23 LAB — CULTURE, OB URINE

## 2015-04-25 ENCOUNTER — Encounter: Payer: Self-pay | Admitting: Family

## 2015-04-25 DIAGNOSIS — D573 Sickle-cell trait: Secondary | ICD-10-CM | POA: Insufficient documentation

## 2015-04-27 ENCOUNTER — Ambulatory Visit (HOSPITAL_COMMUNITY)
Admission: RE | Admit: 2015-04-27 | Discharge: 2015-04-27 | Disposition: A | Discharge status: Home / Self Care | Payer: Medicaid Other | Source: Ambulatory Visit | Attending: Family | Admitting: Family

## 2015-04-27 ENCOUNTER — Other Ambulatory Visit: Payer: Self-pay | Admitting: Family

## 2015-04-27 DIAGNOSIS — N8312 Corpus luteum cyst of left ovary: Secondary | ICD-10-CM | POA: Diagnosis not present

## 2015-04-27 DIAGNOSIS — O3481 Maternal care for other abnormalities of pelvic organs, first trimester: Secondary | ICD-10-CM | POA: Diagnosis not present

## 2015-04-27 DIAGNOSIS — Z36 Encounter for antenatal screening of mother: Secondary | ICD-10-CM | POA: Insufficient documentation

## 2015-04-27 DIAGNOSIS — Z3490 Encounter for supervision of normal pregnancy, unspecified, unspecified trimester: Secondary | ICD-10-CM

## 2015-04-27 DIAGNOSIS — Z3A12 12 weeks gestation of pregnancy: Secondary | ICD-10-CM | POA: Diagnosis not present

## 2015-04-27 DIAGNOSIS — O3680X0 Pregnancy with inconclusive fetal viability, not applicable or unspecified: Secondary | ICD-10-CM

## 2015-05-02 ENCOUNTER — Telehealth: Payer: Self-pay | Admitting: General Practice

## 2015-05-02 NOTE — Telephone Encounter (Signed)
Per ZOXWRUEWalidah, patient needs first screen. Scheduled for tomorrow 12/28 @ 1045. Called patient and her husband answered stating she was at work. Told him to let the patient know of an appt tomorrow at 10:45 on the first floor. He stated he would and had no questions

## 2015-05-03 ENCOUNTER — Ambulatory Visit (HOSPITAL_COMMUNITY): Payer: Medicaid Other

## 2015-05-03 ENCOUNTER — Ambulatory Visit (HOSPITAL_COMMUNITY): Payer: Medicaid Other | Attending: Family

## 2015-05-07 NOTE — L&D Delivery Note (Signed)
Delivery Note Called by RN for delivery, but At 8:10 PM a viable female had been delivered by RN via Vaginal, Spontaneous Delivery (Presentation: ; Occiput Anterior).  APGAR: 6, 9; weight 5 lb 14.9 oz (2690 g).  Cord was clamped and cut, and infant was in warmer upon my arrival. Placenta status: Intact, Spontaneous.  Cord: 3 vessels   Anesthesia: None  Episiotomy: None Lacerations: None Est. Blood Loss (mL): 100  Mom to postpartum.  Baby to Couplet care / Skin to Skin.  Cam HaiSHAW, Jah Alarid CNM 11/03/2015, 8:26 PM

## 2015-05-31 ENCOUNTER — Encounter: Payer: Self-pay | Admitting: Obstetrics & Gynecology

## 2015-05-31 ENCOUNTER — Ambulatory Visit (INDEPENDENT_AMBULATORY_CARE_PROVIDER_SITE_OTHER): Payer: Medicaid Other | Admitting: Certified Nurse Midwife

## 2015-05-31 ENCOUNTER — Encounter: Payer: Self-pay | Admitting: Certified Nurse Midwife

## 2015-05-31 VITALS — BP 118/61 | HR 68 | Temp 98.0°F | Wt 121.5 lb

## 2015-05-31 DIAGNOSIS — O09522 Supervision of elderly multigravida, second trimester: Secondary | ICD-10-CM | POA: Diagnosis not present

## 2015-05-31 DIAGNOSIS — O09529 Supervision of elderly multigravida, unspecified trimester: Secondary | ICD-10-CM | POA: Insufficient documentation

## 2015-05-31 DIAGNOSIS — O99012 Anemia complicating pregnancy, second trimester: Secondary | ICD-10-CM | POA: Diagnosis not present

## 2015-05-31 DIAGNOSIS — D573 Sickle-cell trait: Secondary | ICD-10-CM | POA: Diagnosis not present

## 2015-05-31 DIAGNOSIS — Z3490 Encounter for supervision of normal pregnancy, unspecified, unspecified trimester: Secondary | ICD-10-CM | POA: Insufficient documentation

## 2015-05-31 DIAGNOSIS — O09892 Supervision of other high risk pregnancies, second trimester: Secondary | ICD-10-CM

## 2015-05-31 DIAGNOSIS — Z201 Contact with and (suspected) exposure to tuberculosis: Secondary | ICD-10-CM

## 2015-05-31 LAB — POCT URINALYSIS DIP (DEVICE)
Bilirubin Urine: NEGATIVE
GLUCOSE, UA: NEGATIVE mg/dL
Hgb urine dipstick: NEGATIVE
Ketones, ur: NEGATIVE mg/dL
Leukocytes, UA: NEGATIVE
Nitrite: NEGATIVE
PROTEIN: NEGATIVE mg/dL
SPECIFIC GRAVITY, URINE: 1.015 (ref 1.005–1.030)
UROBILINOGEN UA: 0.2 mg/dL (ref 0.0–1.0)
pH: 8.5 — ABNORMAL HIGH (ref 5.0–8.0)

## 2015-05-31 NOTE — Progress Notes (Signed)
   Subjective:    Hailey Chambers is a [id number] [redacted]w[redacted]d being seen today for her first obstetrical visit.  Her obstetrical history is significant for advanced maternal age. Patient does intend to breast feed. Pregnancy history fully reviewed.  Patient reports no complaints.  Filed Vitals:   05/31/15 0842  BP: 118/61  Pulse: 68  Temp: 98 F (36.7 C)  Weight: 121 lb 8 oz (55.112 kg)    HISTORY: OB History  Gravida Para Term Preterm AB SAB TAB Ectopic Multiple Living  0 0 0 0 0 0 3    # Outcome Date GA Lbr Len/2nd Weight Sex Delivery Anes PTL Lv  5 Current           4 Term 04/13/13     Jerral Ralph  3 Term 09/03/10     Vag-Spont   Y  2 Term 08/2009     Vag-Spont        Comments: Child died at 62 months of age from "an illness"  1 Term 02/22/08     Vag-Spont   Y     Past Medical History  Diagnosis Date  . Positive H. pylori test 06/03/2014   Past Surgical History  Procedure Laterality Date  . No past surgeries     Family History  Problem Relation Age of Onset  . Hypertension Mother   . Diabetes Neg Hx   . Cancer Neg Hx   . Heart disease Neg Hx      Exam    Uterus:     Pelvic Exam:    Perineum:    Vulva:    Vagina:     pH:    Cervix:    Adnexa:    Bony Pelvis:   System: Breast:     Skin: normal coloration and turgor, no rashes    Neurologic: oriented, normal   Extremities: normal strength, tone, and muscle mass   HEENT    Mouth/Teeth mucous membranes moist, pharynx normal without lesions   Neck supple and no masses   Cardiovascular: regular rate and rhythm   Respiratory:  appears well, vitals normal, no respiratory distress, acyanotic, normal RR, ear and throat exam is normal, neck free of mass or lymphadenopathy, chest clear, no wheezing, crepitations, rhonchi, normal symmetric air entry   Abdomen: soft, non-tender; bowel sounds normal; no masses,  no organomegaly   Urinary:       Assessment:    Pregnancy: L2G4010 Patient Active Problem List    Diagnosis Date Noted  . Supervision of other high risk pregnancies, second trimester 05/31/2015  . Sickle cell trait (HCC) 04/25/2015  . Elevated BP 09/22/2014  . Abdominal pain, chronic, epigastric 06/02/2014        Plan:     Initial labs drawn. Prenatal vitamins. Problem list reviewed and updated. Genetic Screening discussed Quad Screen: requested.  Ultrasound discussed; fetal survey: ordered. TB skin test today Advised pt to tell Husband to go to [facility] to be tested  Follow up in 4 weeks. 50% of 30 min visit spent on counseling and coordination of care.     Clemmons,Lori Grissett 05/31/2015

## 2015-05-31 NOTE — Progress Notes (Signed)
Initial prenatal labs complete Has had flu vaccine Need u/a Swahili interpreter [phone]

## 2015-05-31 NOTE — Patient Instructions (Signed)

## 2015-06-01 ENCOUNTER — Encounter: Payer: Medicaid Other | Admitting: Family Medicine

## 2015-06-01 LAB — AFP, QUAD SCREEN
AFP: 36.3 ng/mL
Age Alone: 1:108 {titer}
Curr Gest Age: 17 wks.days
Down Syndrome Scr Risk Est: 1:291 {titer}
HCG, Total: 57.6 IU/mL
INH: 82.2 pg/mL
Interpretation-AFP: NEGATIVE
MoM for AFP: 0.75
MoM for INH: 0.45
MoM for hCG: 1.37
Open Spina bifida: NEGATIVE
Osb Risk: <1:54600 {titer}
Tri 18 Scr Risk Est: NEGATIVE
Trisomy 18 (Edward) Syndrome Interp.: 1:5530 {titer}
uE3 Mom: 0.86
uE3 Value: 0.94 ng/mL

## 2015-06-07 ENCOUNTER — Other Ambulatory Visit: Payer: Self-pay | Admitting: Certified Nurse Midwife

## 2015-06-07 ENCOUNTER — Ambulatory Visit (HOSPITAL_COMMUNITY)
Admission: RE | Admit: 2015-06-07 | Discharge: 2015-06-07 | Disposition: A | Discharge status: Home / Self Care | Payer: Medicaid Other | Source: Ambulatory Visit | Attending: Certified Nurse Midwife | Admitting: Certified Nurse Midwife

## 2015-06-07 ENCOUNTER — Encounter: Payer: Medicaid Other | Admitting: Certified Nurse Midwife

## 2015-06-07 DIAGNOSIS — D573 Sickle-cell trait: Secondary | ICD-10-CM

## 2015-06-07 DIAGNOSIS — Z3A18 18 weeks gestation of pregnancy: Secondary | ICD-10-CM | POA: Diagnosis not present

## 2015-06-07 DIAGNOSIS — O09522 Supervision of elderly multigravida, second trimester: Secondary | ICD-10-CM | POA: Diagnosis not present

## 2015-06-07 DIAGNOSIS — Z201 Contact with and (suspected) exposure to tuberculosis: Secondary | ICD-10-CM

## 2015-06-07 DIAGNOSIS — Z36 Encounter for antenatal screening of mother: Secondary | ICD-10-CM | POA: Insufficient documentation

## 2015-06-07 DIAGNOSIS — O09892 Supervision of other high risk pregnancies, second trimester: Secondary | ICD-10-CM

## 2015-06-09 ENCOUNTER — Encounter: Payer: Self-pay | Admitting: Family Medicine

## 2015-06-14 ENCOUNTER — Ambulatory Visit (HOSPITAL_COMMUNITY): Payer: Medicaid Other

## 2015-06-28 ENCOUNTER — Encounter: Payer: Medicaid Other | Admitting: Advanced Practice Midwife

## 2015-07-05 ENCOUNTER — Ambulatory Visit (INDEPENDENT_AMBULATORY_CARE_PROVIDER_SITE_OTHER): Payer: Medicaid Other | Admitting: Certified Nurse Midwife

## 2015-07-05 ENCOUNTER — Encounter: Payer: Self-pay | Admitting: Certified Nurse Midwife

## 2015-07-05 VITALS — BP 116/63 | HR 60 | Temp 98.4°F | Wt 122.0 lb

## 2015-07-05 DIAGNOSIS — O09522 Supervision of elderly multigravida, second trimester: Secondary | ICD-10-CM

## 2015-07-05 DIAGNOSIS — O99012 Anemia complicating pregnancy, second trimester: Secondary | ICD-10-CM

## 2015-07-05 DIAGNOSIS — D573 Sickle-cell trait: Secondary | ICD-10-CM | POA: Diagnosis not present

## 2015-07-05 DIAGNOSIS — Z3492 Encounter for supervision of normal pregnancy, unspecified, second trimester: Secondary | ICD-10-CM

## 2015-07-05 LAB — POCT URINALYSIS DIP (DEVICE)
Bilirubin Urine: NEGATIVE
Glucose, UA: NEGATIVE mg/dL
Hgb urine dipstick: NEGATIVE
Ketones, ur: NEGATIVE mg/dL
Leukocytes, UA: NEGATIVE
Nitrite: NEGATIVE
Protein, ur: NEGATIVE mg/dL
Specific Gravity, Urine: 1.015 (ref 1.005–1.030)
Urobilinogen, UA: 0.2 mg/dL (ref 0.0–1.0)
pH: 7 (ref 5.0–8.0)

## 2015-07-05 MED ORDER — PRENATAL VITAMINS 0.8 MG PO TABS: 1.0000 | ORAL_TABLET | Freq: Every day | ORAL | Status: DC

## 2015-07-05 NOTE — Progress Notes (Signed)
Patient reports itchy white discharge [employer] interpreter [phone] Reviewed tip of week with patient

## 2015-07-05 NOTE — Patient Instructions (Signed)

## 2015-07-05 NOTE — Progress Notes (Signed)
Subjective:  Hailey Chambers is a 40 y.o. G5P4003 at [redacted]w[redacted]d being seen today for ongoing prenatal care.  She is currently monitored for the following issues for this high-risk pregnancy and has Abdominal pain, chronic, epigastric; Elevated BP; Sickle cell trait (HCC); Supervision of normal pregnancy; and AMA (advanced maternal age) multigravida 35+ on her problem list.  Patient reports no complaints.  Contractions: Not present. Vag. Bleeding: None.  Movement: Present. Denies leaking of fluid.   The following portions of the patient's history were reviewed and updated as appropriate: allergies, current medications, past family history, past medical history, past social history, past surgical history and problem list. Problem list updated.  Objective:   Filed Vitals:   07/05/15 1029  BP: 116/63  Pulse: 60  Temp: 98.4 F (36.9 C)  Weight: 122 lb (55.339 kg)    Fetal Status: Fetal Heart Rate (bpm): 154 Fundal Height: 22 cm Movement: Present     General:  Alert, oriented and cooperative. Patient is in no acute distress.  Skin: Skin is warm and dry. No rash noted.   Cardiovascular: Normal heart rate noted  Respiratory: Normal respiratory effort, no problems with respiration noted  Abdomen: Soft, gravid, appropriate for gestational age. Pain/Pressure: Present     Pelvic: Vag. Bleeding: None Vag D/C Character: White   Cervical exam deferred        Extremities: Normal range of motion.  Edema: Trace  Mental Status: Normal mood and affect. Normal behavior. Normal judgment and thought content.   Urinalysis: Urine Protein: Negative Urine Glucose: Negative  Assessment and Plan:  Pregnancy: G5P4003 at [redacted]w[redacted]d  1. Supervision of normal pregnancy, second trimester   2. AMA (advanced maternal age) multigravida 35+, second trimester   3. Sickle cell trait (HCC)   Preterm labor symptoms and general obstetric precautions including but not limited to vaginal bleeding, contractions, leaking of  fluid and fetal movement were reviewed in detail with the patient. Please refer to After Visit Summary for other counseling recommendations.  Return in about 4 weeks (around 08/02/2015).   Rhea Pink, CNM

## 2015-07-06 LAB — WET PREP, GENITAL
Trich, Wet Prep: NONE SEEN
WBC, Wet Prep HPF POC: NONE SEEN
Yeast Wet Prep HPF POC: NONE SEEN

## 2015-08-02 ENCOUNTER — Encounter: Payer: Medicaid Other | Admitting: Certified Nurse Midwife

## 2015-08-09 ENCOUNTER — Encounter: Payer: Medicaid Other | Admitting: Certified Nurse Midwife

## 2015-08-23 ENCOUNTER — Ambulatory Visit (INDEPENDENT_AMBULATORY_CARE_PROVIDER_SITE_OTHER): Payer: Medicaid Other | Admitting: Advanced Practice Midwife

## 2015-08-23 ENCOUNTER — Encounter: Payer: Self-pay | Admitting: Advanced Practice Midwife

## 2015-08-23 VITALS — BP 130/67 | HR 64 | Temp 98.4°F | Wt 126.9 lb

## 2015-08-23 DIAGNOSIS — Z3493 Encounter for supervision of normal pregnancy, unspecified, third trimester: Secondary | ICD-10-CM | POA: Diagnosis present

## 2015-08-23 DIAGNOSIS — O1202 Gestational edema, second trimester: Secondary | ICD-10-CM

## 2015-08-23 DIAGNOSIS — Z23 Encounter for immunization: Secondary | ICD-10-CM

## 2015-08-23 LAB — POCT URINALYSIS DIP (DEVICE)
BILIRUBIN URINE: NEGATIVE
Glucose, UA: NEGATIVE mg/dL
Ketones, ur: NEGATIVE mg/dL
LEUKOCYTES UA: NEGATIVE
Nitrite: NEGATIVE
Protein, ur: NEGATIVE mg/dL
SPECIFIC GRAVITY, URINE: 1.01 (ref 1.005–1.030)
UROBILINOGEN UA: 0.2 mg/dL (ref 0.0–1.0)
pH: 7 (ref 5.0–8.0)

## 2015-08-23 LAB — CBC
HEMATOCRIT: 30.1 % — AB (ref 35.0–45.0)
Hemoglobin: 9.7 g/dL — ABNORMAL LOW (ref 11.7–15.5)
MCH: 25.9 pg — ABNORMAL LOW (ref 27.0–33.0)
MCHC: 32.2 g/dL (ref 32.0–36.0)
MCV: 80.3 fL (ref 80.0–100.0)
MPV: 11 fL (ref 7.5–12.5)
Platelets: 158 10*3/uL (ref 140–400)
RBC: 3.75 MIL/uL — AB (ref 3.80–5.10)
RDW: 14.7 % (ref 11.0–15.0)
WBC: 6.6 10*3/uL (ref 3.8–10.8)

## 2015-08-23 MED ORDER — TETANUS-DIPHTH-ACELL PERTUSSIS 5-2.5-18.5 LF-MCG/0.5 IM SUSP
0.5000 mL | Freq: Once | INTRAMUSCULAR | Status: AC
  Administered 2015-08-23: 0.5 mL via INTRAMUSCULAR

## 2015-08-23 MED ORDER — T.E.D. KNEE LENGTH/S-REGULAR MISC: 1.0000 | Freq: Every day | Status: DC

## 2015-08-23 MED ORDER — COMFORT FIT MATERNITY SUPP MED MISC: 1.0000 | Freq: Every day | Status: DC

## 2015-08-23 NOTE — Patient Instructions (Addendum)
Please ask your husband to be tested for Sickle Cell at his doctors.    Preterm Labor Information Preterm labor is when labor starts at less than 37 weeks of pregnancy. The normal length of a pregnancy is 39 to 41 weeks. CAUSES Often, there is no identifiable underlying cause as to why a woman goes into preterm labor. One of the most common known causes of preterm labor is infection. Infections of the uterus, cervix, vagina, amniotic sac, bladder, kidney, or even the lungs (pneumonia) can cause labor to start. Other suspected causes of preterm labor include:   Urogenital infections, such as yeast infections and bacterial vaginosis.   Uterine abnormalities (uterine shape, uterine septum, fibroids, or bleeding from the placenta).   A cervix that has been operated on (it may fail to stay closed).   Malformations in the fetus.   Multiple gestations (twins, triplets, and so on).   Breakage of the amniotic sac.  RISK FACTORS  Having a previous history of preterm labor.   Having premature rupture of membranes (PROM).   Having a placenta that covers the opening of the cervix (placenta previa).   Having a placenta that separates from the uterus (placental abruption).   Having a cervix that is too weak to hold the fetus in the uterus (incompetent cervix).   Having too much fluid in the amniotic sac (polyhydramnios).   Taking illegal drugs or smoking while pregnant.   Not gaining enough weight while pregnant.   Being younger than 18 and older65 than 40 years old.   Having a low socioeconomic status.   Being African American. SYMPTOMS Signs and symptoms of preterm labor include:   Menstrual-like cramps, abdominal pain, or back pain.  Uterine contractions that are regular, as frequent as six in an hour, regardless of their intensity (may be mild or painful).  Contractions that start on the top of the uterus and spread down to the lower abdomen and back.   A sense  of increased pelvic pressure.   A watery or bloody mucus discharge that comes from the vagina.  TREATMENT Depending on the length of the pregnancy and other circumstances, your health care provider may suggest bed rest. If necessary, there are medicines that can be given to stop contractions and to mature the fetal lungs. If labor happens before 34 weeks of pregnancy, a prolonged hospital stay may be recommended. Treatment depends on the condition of both you and the fetus.  WHAT SHOULD YOU DO IF YOU THINK YOU ARE IN PRETERM LABOR? Call your health care provider right away. You will need to go to the hospital to get checked immediately. HOW CAN YOU PREVENT PRETERM LABOR IN FUTURE PREGNANCIES? You should:   Stop smoking if you smoke.  Maintain healthy weight gain and avoid chemicals and drugs that are not necessary.  Be watchful for any type of infection.  Inform your health care provider if you have a known history of preterm labor.   This information is not intended to replace advice given to you by your health care provider. Make sure you discuss any questions you have with your health care provider.   Document Released: 07/13/2003 Document Revised: 12/23/2012 Document Reviewed: 05/25/2012 Elsevier Interactive Patient Education [employer]2016 Elsevier Inc.

## 2015-08-23 NOTE — Progress Notes (Signed)
Used [employer]#[id number].

## 2015-08-24 ENCOUNTER — Encounter: Payer: Self-pay | Admitting: Advanced Practice Midwife

## 2015-08-24 DIAGNOSIS — IMO0002 Reserved for concepts with insufficient information to code with codable children: Secondary | ICD-10-CM | POA: Insufficient documentation

## 2015-08-24 DIAGNOSIS — Z789 Other specified health status: Secondary | ICD-10-CM | POA: Insufficient documentation

## 2015-08-24 DIAGNOSIS — O358XX0 Maternal care for other (suspected) fetal abnormality and damage, not applicable or unspecified: Secondary | ICD-10-CM | POA: Insufficient documentation

## 2015-08-24 LAB — HIV ANTIBODY (ROUTINE TESTING W REFLEX): HIV 1&2 Ab, 4th Generation: NONREACTIVE

## 2015-08-24 LAB — RPR

## 2015-08-24 LAB — GLUCOSE TOLERANCE, 1 HOUR (50G) W/O FASTING: Glucose, 1 Hr, gestational: 146 mg/dL — ABNORMAL HIGH (ref <140)

## 2015-08-28 NOTE — Progress Notes (Signed)
Subjective:  Hailey Chambers is a 40 y.o. G5P4003 at 6028w5d being seen today for ongoing prenatal care.  She is currently monitored for the following issues for this high-risk pregnancy and has Abdominal pain, chronic, epigastric; Elevated BP; Sickle cell trait (HCC); Supervision of normal pregnancy; AMA (advanced maternal age) multigravida 35+; Language barrier; and Fetal polydactyly affecting antepartum care of mother on her problem list.  Patient reports fatigue, swelling in feet bilat.  Contractions: Not present. Vag. Bleeding: None.  Movement: Present. Denies leaking of fluid.   The following portions of the patient's history were reviewed and updated as appropriate: allergies, current medications, past family history, past medical history, past social history, past surgical history and problem list. Problem list updated.  Requesting to be taken out of work because it is tiring and her feet swell.   Objective:   Filed Vitals:   08/23/15 1021  BP: 130/67  Pulse: 64  Temp: 98.4 F (36.9 C)  Weight: 126 lb 14.4 oz (57.561 kg)    Fetal Status: Fetal Heart Rate (bpm): 156 Fundal Height: 28 cm Movement: Present     General:  Alert, oriented and cooperative. Patient is in no acute distress.  Skin: Skin is warm and dry. No rash noted.   Cardiovascular: Normal heart rate noted  Respiratory: Normal respiratory effort, no problems with respiration noted  Abdomen: Soft, gravid, appropriate for gestational age. Pain/Pressure: Absent     Pelvic: Vag. Bleeding: None     Cervical exam deferred        Extremities: Normal range of motion.  Edema: Mild pitting, slight indentation  Mental Status: Normal mood and affect. Normal behavior. Normal judgment and thought content.   Urinalysis: Urine Protein: Negative Urine Glucose: Negative  Assessment and Plan:  Pregnancy: G5P4003 at 5328w5d  1. Supervision of normal pregnancy, third trimester  - Glucose Tolerance, 1 HR (50g) w/o Fasting - CBC -  RPR - HIV antibody (with reflex)  2. Edema during pregnancy in second trimester  - Elastic Bandages & Supports (COMFORT FIT MATERNITY SUPP MED) MISC; 1 Device by Does not apply route daily.  Dispense: 1 each; Refill: 0 - Elastic Bandages & Supports (T.E.D. KNEE LENGTH/S-REGULAR) MISC; 1 Device by Does not apply route daily.  Dispense: 1 each; Refill: 1  Preterm labor symptoms and general obstetric precautions including but not limited to vaginal bleeding, contractions, leaking of fluid and fetal movement were reviewed in detail with the patient. Please refer to After Visit Summary for other counseling recommendations.  Explained that there is no medical indication to take her out of work, but Rx's TED hose, maternity support belt and offered not for frequent bathroom/water breaks.  Return in about 2 weeks (around 09/06/2015).   Dorathy KinsmanVirginia Weslee Fogg, CNM

## 2015-08-31 ENCOUNTER — Encounter: Payer: Self-pay | Admitting: Advanced Practice Midwife

## 2015-08-31 DIAGNOSIS — O99019 Anemia complicating pregnancy, unspecified trimester: Secondary | ICD-10-CM | POA: Insufficient documentation

## 2015-09-06 ENCOUNTER — Encounter: Payer: Self-pay | Admitting: Student

## 2015-09-06 ENCOUNTER — Telehealth: Payer: Self-pay | Admitting: *Deleted

## 2015-09-06 NOTE — Telephone Encounter (Addendum)
Per message from Virginia Smith, CNM patient needs 3 hour GTT sAlabamacheduled and also is anemia needs to Start OTC. FeSO4 BID.  [place]ed [place] with [employer]#[id number]. Spoke with female who said he had the phone and wasn't [facility]. Asked Him if he another number we could call her with- he said didn't know number. Asked him to please have her call us tomorrow.

## 2015-09-07 ENCOUNTER — Encounter: Payer: Self-pay | Admitting: *Deleted

## 2015-09-07 NOTE — Telephone Encounter (Signed)
Called [place] with [place] [employer]#[id number] and was unable to leave a message- heard a message voicemail Not set up.  No other contact number listed. Will send letter.

## 2015-09-18 NOTE — Telephone Encounter (Signed)
Also put note on next ob fu appt

## 2015-09-19 ENCOUNTER — Ambulatory Visit (INDEPENDENT_AMBULATORY_CARE_PROVIDER_SITE_OTHER): Payer: Medicaid Other | Admitting: Advanced Practice Midwife

## 2015-09-19 ENCOUNTER — Encounter: Payer: Self-pay | Admitting: Advanced Practice Midwife

## 2015-09-19 VITALS — BP 124/67 | HR 72 | Wt 126.7 lb

## 2015-09-19 DIAGNOSIS — O99013 Anemia complicating pregnancy, third trimester: Secondary | ICD-10-CM | POA: Diagnosis not present

## 2015-09-19 DIAGNOSIS — O26893 Other specified pregnancy related conditions, third trimester: Secondary | ICD-10-CM

## 2015-09-19 DIAGNOSIS — O09523 Supervision of elderly multigravida, third trimester: Secondary | ICD-10-CM

## 2015-09-19 DIAGNOSIS — O1202 Gestational edema, second trimester: Secondary | ICD-10-CM | POA: Diagnosis not present

## 2015-09-19 DIAGNOSIS — R102 Pelvic and perineal pain: Secondary | ICD-10-CM

## 2015-09-19 DIAGNOSIS — N949 Unspecified condition associated with female genital organs and menstrual cycle: Secondary | ICD-10-CM

## 2015-09-19 DIAGNOSIS — D649 Anemia, unspecified: Secondary | ICD-10-CM

## 2015-09-19 LAB — POCT URINALYSIS DIP (DEVICE)
BILIRUBIN URINE: NEGATIVE
Glucose, UA: NEGATIVE mg/dL
HGB URINE DIPSTICK: NEGATIVE
KETONES UR: NEGATIVE mg/dL
Leukocytes, UA: NEGATIVE
Nitrite: NEGATIVE
PH: 7 (ref 5.0–8.0)
Protein, ur: NEGATIVE mg/dL
Specific Gravity, Urine: 1.015 (ref 1.005–1.030)
Urobilinogen, UA: 1 mg/dL (ref 0.0–1.0)

## 2015-09-19 MED ORDER — COMFORT FIT MATERNITY SUPP MED MISC: 1.0000 | Freq: Every day | Status: DC

## 2015-09-19 MED ORDER — T.E.D. KNEE LENGTH/S-REGULAR MISC: 1.0000 | Freq: Every day | Status: DC

## 2015-09-19 MED ORDER — FERROUS SULFATE 325 (65 FE) MG PO TABS: 325.0000 mg | ORAL_TABLET | Freq: Two times a day (BID) | ORAL | Status: DC

## 2015-09-19 NOTE — Patient Instructions (Signed)
Recommend for swelling:  Compression Socks Support Socks

## 2015-09-19 NOTE — Progress Notes (Signed)
Pt informed of abnormal 1 hr. She states that she will come next Tuesday for her 3hr. She understands to be fasting.

## 2015-09-19 NOTE — Progress Notes (Signed)
Subjective:  Hailey Chambers is a 40 y.o. G5P4003 at 5176w6d being seen today for ongoing prenatal care.  She is currently monitored for the following issues for this high-risk pregnancy and has Abdominal pain, chronic, epigastric; Elevated BP; Sickle cell trait (HCC); Supervision of normal pregnancy; AMA (advanced maternal age) multigravida 35+; Language barrier; Fetal polydactyly affecting antepartum care of mother; and Anemia affecting pregnancy, antepartum on her problem list.  Patient reports swelling and pain in both feet ankles and legs.  Contractions: Not present. Vag. Bleeding: None.  Movement: Present. Denies leaking of fluid.   The following portions of the patient's history were reviewed and updated as appropriate: allergies, current medications, past family history, past medical history, past social history, past surgical history and problem list. Problem list updated.  Objective:   Filed Vitals:   09/19/15 1536  BP: 124/67  Pulse: 72  Weight: 126 lb 11.2 oz (57.471 kg)    Fetal Status: Fetal Heart Rate (bpm): 144 Fundal Height: 32 cm Movement: Present     General:  Alert, oriented and cooperative. Patient is in no acute distress.  Skin: Skin is warm and dry. No rash noted.   Cardiovascular: Normal heart rate noted  Respiratory: Normal respiratory effort, no problems with respiration noted  Abdomen: Soft, gravid, appropriate for gestational age. Pain/Pressure: Absent     Pelvic: Vag. Bleeding: None     Cervical exam deferred        Extremities: Normal range of motion.  Edema: Moderate pitting, indentation subsides rapidly  Mental Status: Normal mood and affect. Normal behavior. Normal judgment and thought content.   Urinalysis: Urine Protein: Negative Urine Glucose: Negative  Assessment and Plan:  Pregnancy: G5P4003 at 5676w6d  1. AMA (advanced maternal age) multigravida 35+, third trimester  - US MFM OB FOLLOW UP; Future  2. Edema during pregnancy in second  trimester --Pt has language barrier and does not know where to pick up Rx.  RN called medical supply but the following not covered by pregnancy medicaid.  Pt to call her case worker regarding insurance coverage. --Discussed places to purchase low cost support socks/hose and maternity belts. --Work restrictions letter provider to limit pt standing - Education officer, environmentallastic Bandages & Supports (T.E.D. KNEE LENGTH/S-REGULAR) MISC; 1 Device by Does not apply route daily.  Dispense: 1 each; Refill: 1 - Elastic Bandages & Supports (COMFORT FIT MATERNITY SUPP MED) MISC; 1 Device by Does not apply route daily.  Dispense: 1 each; Refill: 0  3. Pelvic pressure in pregnancy, third trimester  - Elastic Bandages & Supports (COMFORT FIT MATERNITY SUPP MED) MISC; 1 Device by Does not apply route daily.  Dispense: 1 each; Refill: 0  4. Anemia affecting pregnancy in third trimester --Pt has [facility] Raynald Bl[facility]Linda [facility]ang [facility] and notice that pt not available by phone. Letter states pt should take iron twice per day. Pt thought she should take PNV twice per day.  Rx for Ferrous sulfate to take BID in addition to PNV. Explained to pt using hospital Swahili interpreter.  Preterm labor symptoms and general obstetric precautions including but not limited to vaginal bleeding, contractions, leaking of fluid and fetal movement were reviewed in detail with the patient. Please refer to After Visit Summary for other counseling recommendations.  Return in about 1 week (around 09/26/2015) for 3hr gtt on 5/23 at 0800.Marland Kitchen.   Hurshel PartyLisa A Leftwich-Kirby, CNM

## 2015-09-26 ENCOUNTER — Other Ambulatory Visit: Payer: Medicaid Other

## 2015-09-26 DIAGNOSIS — O24419 Gestational diabetes mellitus in pregnancy, unspecified control: Secondary | ICD-10-CM

## 2015-09-27 LAB — GLUCOSE TOLERANCE, 3 HOURS
Glucose Tolerance, 1 hour: 151 mg/dL (ref <190)
Glucose Tolerance, 2 hour: 101 mg/dL (ref <165)
Glucose Tolerance, Fasting: 66 mg/dL (ref 65–104)
Glucose, GTT - 3 Hour: 94 mg/dL (ref <145)

## 2015-10-03 ENCOUNTER — Ambulatory Visit (HOSPITAL_COMMUNITY)
Admission: RE | Admit: 2015-10-03 | Discharge: 2015-10-03 | Disposition: A | Discharge status: Home / Self Care | Payer: Medicaid Other | Source: Ambulatory Visit | Attending: Advanced Practice Midwife | Admitting: Advanced Practice Midwife

## 2015-10-03 ENCOUNTER — Other Ambulatory Visit: Payer: Self-pay | Admitting: Advanced Practice Midwife

## 2015-10-03 DIAGNOSIS — Z3A35 35 weeks gestation of pregnancy: Secondary | ICD-10-CM

## 2015-10-03 DIAGNOSIS — O09523 Supervision of elderly multigravida, third trimester: Secondary | ICD-10-CM

## 2015-10-03 DIAGNOSIS — Z3493 Encounter for supervision of normal pregnancy, unspecified, third trimester: Secondary | ICD-10-CM

## 2015-10-10 ENCOUNTER — Ambulatory Visit (INDEPENDENT_AMBULATORY_CARE_PROVIDER_SITE_OTHER): Payer: Medicaid Other | Admitting: Student

## 2015-10-10 ENCOUNTER — Other Ambulatory Visit (HOSPITAL_COMMUNITY)
Admission: RE | Admit: 2015-10-10 | Discharge: 2015-10-10 | Disposition: A | Discharge status: Home / Self Care | Payer: Medicaid Other | Source: Ambulatory Visit | Attending: Family Medicine | Admitting: Family Medicine

## 2015-10-10 VITALS — BP 119/66 | HR 67 | Wt 127.0 lb

## 2015-10-10 DIAGNOSIS — Z789 Other specified health status: Secondary | ICD-10-CM

## 2015-10-10 DIAGNOSIS — Z3493 Encounter for supervision of normal pregnancy, unspecified, third trimester: Secondary | ICD-10-CM

## 2015-10-10 DIAGNOSIS — Z113 Encounter for screening for infections with a predominantly sexual mode of transmission: Secondary | ICD-10-CM | POA: Insufficient documentation

## 2015-10-10 LAB — POCT URINALYSIS DIP (DEVICE)
Bilirubin Urine: NEGATIVE
Glucose, UA: NEGATIVE mg/dL
Hgb urine dipstick: NEGATIVE
Ketones, ur: NEGATIVE mg/dL
Nitrite: NEGATIVE
PH: 6.5 (ref 5.0–8.0)
PROTEIN: NEGATIVE mg/dL
Specific Gravity, Urine: 1.015 (ref 1.005–1.030)
Urobilinogen, UA: 0.2 mg/dL (ref 0.0–1.0)

## 2015-10-10 LAB — OB RESULTS CONSOLE GC/CHLAMYDIA: GC PROBE AMP, GENITAL: NEGATIVE

## 2015-10-10 LAB — OB RESULTS CONSOLE GBS: STREP GROUP B AG: NEGATIVE

## 2015-10-10 NOTE — Progress Notes (Signed)
Subjective:  Hailey Chambers is a 40 y.o. G5P4003 at 3076w6d being seen today for ongoing prenatal care.  She is currently monitored for the following issues for this high-risk pregnancy and has Abdominal pain, chronic, epigastric; Elevated BP; Sickle cell trait (HCC); Supervision of normal pregnancy; AMA (advanced maternal age) multigravida 35+; Language barrier; Fetal polydactyly affecting antepartum care of mother; and Anemia affecting pregnancy, antepartum on her problem list.  Patient reports no bleeding, no contractions and no leaking.  Contractions: Not present.  .  Movement: Present. Denies leaking of fluid.   The following portions of the patient's history were reviewed and updated as appropriate: allergies, current medications, past family history, past medical history, past social history, past surgical history and problem list. Problem list updated.  Objective:   Filed Vitals:   10/10/15 1030  BP: 119/66  Pulse: 67  Weight: 127 lb (57.607 kg)    Fetal Status: Fetal Heart Rate (bpm): 156 Fundal Height: 33 cm Movement: Present  Presentation: Vertex  General:  Alert, oriented and cooperative. Patient is in no acute distress.  Skin: Skin is warm and dry. No rash noted.   Cardiovascular: Normal heart rate noted  Respiratory: Normal respiratory effort, no problems with respiration noted  Abdomen: Soft, gravid, appropriate for gestational age. Pain/Pressure: Absent     Pelvic:       Cervical exam performed Dilation: 1 Effacement (%): Thick Station: Ballotable  Extremities: Normal range of motion.  Edema: Trace  Mental Status: Normal mood and affect. Normal behavior. Normal judgment and thought content.   Urinalysis: Urine Protein: Negative Urine Glucose: Negative  Assessment and Plan:  Pregnancy: G5P4003 at 7076w6d  1. Supervision of normal pregnancy, third trimester  - Culture, beta strep (group b only) - GC[facility]Chlamydia [facility] amp [facility]  2. Language  barrier  - Swahili interpreter used  Preterm labor symptoms and general obstetric precautions including but not limited to vaginal bleeding, contractions, leaking of fluid and fetal movement were reviewed in detail with the patient. Please refer to After Visit Summary for other counseling recommendations.  Return in about 1 week (around 10/17/2015) for Routine OB.   Judeth HornErin Nazyia Gaugh, NP

## 2015-10-10 NOTE — Progress Notes (Signed)
Used Tax adviserwahili Interpreter Number [phone].

## 2015-10-10 NOTE — Patient Instructions (Signed)

## 2015-10-11 LAB — GC/CHLAMYDIA PROBE AMP (~~LOC~~) NOT AT ARMC
CHLAMYDIA, DNA PROBE: NEGATIVE
Neisseria Gonorrhea: NEGATIVE

## 2015-10-13 LAB — CULTURE, BETA STREP (GROUP B ONLY)

## 2015-10-24 ENCOUNTER — Ambulatory Visit (INDEPENDENT_AMBULATORY_CARE_PROVIDER_SITE_OTHER): Payer: Medicaid Other | Admitting: Advanced Practice Midwife

## 2015-10-24 VITALS — BP 113/62 | HR 69 | Temp 97.4°F | Wt 129.9 lb

## 2015-10-24 DIAGNOSIS — Z789 Other specified health status: Secondary | ICD-10-CM

## 2015-10-24 DIAGNOSIS — Z3493 Encounter for supervision of normal pregnancy, unspecified, third trimester: Secondary | ICD-10-CM

## 2015-10-24 LAB — POCT URINALYSIS DIP (DEVICE)
BILIRUBIN URINE: NEGATIVE
GLUCOSE, UA: NEGATIVE mg/dL
Hgb urine dipstick: NEGATIVE
Ketones, ur: NEGATIVE mg/dL
LEUKOCYTES UA: NEGATIVE
NITRITE: NEGATIVE
Protein, ur: NEGATIVE mg/dL
Specific Gravity, Urine: 1.015 (ref 1.005–1.030)
Urobilinogen, UA: 0.2 mg/dL (ref 0.0–1.0)
pH: 5.5 (ref 5.0–8.0)

## 2015-10-24 NOTE — Progress Notes (Signed)
Subjective:  Hailey Chambers is a 40 y.o. G5P4003 at 8554w6d being seen today for ongoing prenatal care.  She is currently monitored for the following issues for this low-risk pregnancy and has Abdominal pain, chronic, epigastric; Elevated BP; Sickle cell trait (HCC); Supervision of normal pregnancy; AMA (advanced maternal age) multigravida 35+; Language barrier; Fetal polydactyly affecting antepartum care of mother; and Anemia affecting pregnancy, antepartum on her problem list.  Patient reports no complaints.  Contractions: Irregular. Vag. Bleeding: None.  Movement: Present. Denies leaking of fluid.   The following portions of the patient's history were reviewed and updated as appropriate: allergies, current medications, past family history, past medical history, past social history, past surgical history and problem list. Problem list updated.  Objective:   Filed Vitals:   10/24/15 0932  BP: 113/62  Pulse: 69  Temp: 97.4 F (36.3 C)  Weight: 129 lb 14.4 oz (58.922 kg)    Fetal Status: Fetal Heart Rate (bpm): 136 Fundal Height: 36 cm Movement: Present     General:  Alert, oriented and cooperative. Patient is in no acute distress.  Skin: Skin is warm and dry. No rash noted.   Cardiovascular: Normal heart rate noted  Respiratory: Normal respiratory effort, no problems with respiration noted  Abdomen: Soft, gravid, appropriate for gestational age. Pain/Pressure: Present     Pelvic: Cervical exam deferred        Extremities: Normal range of motion.  Edema: Mild pitting, slight indentation  Mental Status: Normal mood and affect. Normal behavior. Normal judgment and thought content.   Urinalysis:      Assessment and Plan:  Pregnancy: G5P4003 at 5954w6d  1. Supervision of normal pregnancy, third trimester   2. Language barrier --Hospital interpreter used.  Term labor symptoms and general obstetric precautions including but not limited to vaginal bleeding, contractions, leaking of fluid  and fetal movement were reviewed in detail with the patient. Please refer to After Visit Summary for other counseling recommendations.  Return in about 1 week (around 10/31/2015).   Hurshel PartyLisa A Leftwich-Kirby, CNM

## 2015-10-24 NOTE — Progress Notes (Signed)
[employer] tradernterpreter

## 2015-10-31 ENCOUNTER — Ambulatory Visit (INDEPENDENT_AMBULATORY_CARE_PROVIDER_SITE_OTHER): Payer: Medicaid Other | Admitting: Student

## 2015-10-31 VITALS — BP 109/59 | HR 63 | Wt 128.7 lb

## 2015-10-31 DIAGNOSIS — Z789 Other specified health status: Secondary | ICD-10-CM

## 2015-10-31 DIAGNOSIS — Z3493 Encounter for supervision of normal pregnancy, unspecified, third trimester: Secondary | ICD-10-CM

## 2015-10-31 LAB — POCT URINALYSIS DIP (DEVICE)
Bilirubin Urine: NEGATIVE
Glucose, UA: NEGATIVE mg/dL
Hgb urine dipstick: NEGATIVE
Ketones, ur: NEGATIVE mg/dL
Leukocytes, UA: NEGATIVE
Nitrite: NEGATIVE
Protein, ur: NEGATIVE mg/dL
Specific Gravity, Urine: 1.015 (ref 1.005–1.030)
Urobilinogen, UA: 0.2 mg/dL (ref 0.0–1.0)
pH: 7 (ref 5.0–8.0)

## 2015-10-31 NOTE — Patient Instructions (Signed)
Braxton Hicks Contractions °Contractions of the uterus can occur throughout pregnancy. Contractions are not always a sign that you are in labor.  °WHAT ARE BRAXTON HICKS CONTRACTIONS?  °Contractions that occur before labor are called Braxton Hicks contractions, or false labor. Toward the end of pregnancy (32-34 weeks), these contractions can develop more often and may become more forceful. This is not true labor because these contractions do not result in opening (dilatation) and thinning of the cervix. They are sometimes difficult to tell apart from true labor because these contractions can be forceful and people have different pain tolerances. You should not feel embarrassed if you go to the hospital with false labor. Sometimes, the only way to tell if you are in true labor is for your health care provider to look for changes in the cervix. °If there are no prenatal problems or other health problems associated with the pregnancy, it is completely safe to be sent home with false labor and await the onset of true labor. °HOW CAN YOU TELL THE DIFFERENCE BETWEEN TRUE AND FALSE LABOR? °False Labor °· The contractions of false labor are usually shorter and not as hard as those of true labor.   °· The contractions are usually irregular.   °· The contractions are often felt in the front of the lower abdomen and in the groin.   °· The contractions may go away when you walk around or change positions while lying down.   °· The contractions get weaker and are shorter lasting as time goes on.   °· The contractions do not usually become progressively stronger, regular, and closer together as with true labor.   °True Labor °1. Contractions in true labor last 30-70 seconds, become very regular, usually become more intense, and increase in frequency.   °2. The contractions do not go away with walking.   °3. The discomfort is usually felt in the top of the uterus and spreads to the lower abdomen and low back.   °4. True labor can  be determined by your health care provider with an exam. This will show that the cervix is dilating and getting thinner.   °WHAT TO REMEMBER °· Keep up with your usual exercises and follow other instructions given by your health care provider.   °· Take medicines as directed by your health care provider.   °· Keep your regular prenatal appointments.   °· Eat and drink lightly if you think you are going into labor.   °· If Braxton Hicks contractions are making you uncomfortable:   °· Change your position from lying down or resting to walking, or from walking to resting.   °· Sit and rest in a tub of warm water.   °· Drink 2-3 glasses of water. Dehydration may cause these contractions.   °· Do slow and deep breathing several times an hour.   °WHEN SHOULD I SEEK IMMEDIATE MEDICAL CARE? °Seek immediate medical care if: °· Your contractions become stronger, more regular, and closer together.   °· You have fluid leaking or gushing from your vagina.   °· You have a fever.   °· You pass blood-tinged mucus.   °· You have vaginal bleeding.   °· You have continuous abdominal pain.   °· You have low back pain that you never had before.   °· You feel your baby's head pushing down and causing pelvic pressure.   °· Your baby is not moving as much as it used to.   °  °This information is not intended to replace advice given to you by your health care provider. Make sure you discuss any questions you have with your health care   provider. °  °Document Released: 04/22/2005 Document Revised: 04/27/2013 Document Reviewed: 02/01/2013 °Elsevier Interactive Patient Education ©2016 Elsevier Inc. ° °Fetal Movement Counts °Patient Name: __________________________________________________ Patient Due Date: ____________________ °Performing a fetal movement count is highly recommended in high-risk pregnancies, but it is good for every pregnant woman to do. Your health care provider may ask you to start counting fetal movements at 28 weeks of the  pregnancy. Fetal movements often increase: °· After eating a full meal. °· After physical activity. °· After eating or drinking something sweet or cold. °· At rest. °Pay attention to when you feel the baby is most active. This will help you notice a pattern of your baby's sleep and wake cycles and what factors contribute to an increase in fetal movement. It is important to perform a fetal movement count at the same time each day when your baby is normally most active.  °HOW TO COUNT FETAL MOVEMENTS °5. Find a quiet and comfortable area to sit or lie down on your left side. Lying on your left side provides the best blood and oxygen circulation to your baby. °6. Write down the day and time on a sheet of paper or in a journal. °7. Start counting kicks, flutters, swishes, rolls, or jabs in a 2-hour period. You should feel at least 10 movements within 2 hours. °8. If you do not feel 10 movements in 2 hours, wait 2-3 hours and count again. Look for a change in the pattern or not enough counts in 2 hours. °SEEK MEDICAL CARE IF: °· You feel less than 10 counts in 2 hours, tried twice. °· There is no movement in over an hour. °· The pattern is changing or taking longer each day to reach 10 counts in 2 hours. °· You feel the baby is not moving as he or she usually does. °Date: ____________ Movements: ____________ Start time: ____________ Finish time: ____________  °Date: ____________ Movements: ____________ Start time: ____________ Finish time: ____________ °Date: ____________ Movements: ____________ Start time: ____________ Finish time: ____________ °Date: ____________ Movements: ____________ Start time: ____________ Finish time: ____________ °Date: ____________ Movements: ____________ Start time: ____________ Finish time: ____________ °Date: ____________ Movements: ____________ Start time: ____________ Finish time: ____________ °Date: ____________ Movements: ____________ Start time: ____________ Finish time:  ____________ °Date: ____________ Movements: ____________ Start time: ____________ Finish time: ____________  °Date: ____________ Movements: ____________ Start time: ____________ Finish time: ____________ °Date: ____________ Movements: ____________ Start time: ____________ Finish time: ____________ °Date: ____________ Movements: ____________ Start time: ____________ Finish time: ____________ °Date: ____________ Movements: ____________ Start time: ____________ Finish time: ____________ °Date: ____________ Movements: ____________ Start time: ____________ Finish time: ____________ °Date: ____________ Movements: ____________ Start time: ____________ Finish time: ____________ °Date: ____________ Movements: ____________ Start time: ____________ Finish time: ____________  °Date: ____________ Movements: ____________ Start time: ____________ Finish time: ____________ °Date: ____________ Movements: ____________ Start time: ____________ Finish time: ____________ °Date: ____________ Movements: ____________ Start time: ____________ Finish time: ____________ °Date: ____________ Movements: ____________ Start time: ____________ Finish time: ____________ °Date: ____________ Movements: ____________ Start time: ____________ Finish time: ____________ °Date: ____________ Movements: ____________ Start time: ____________ Finish time: ____________ °Date: ____________ Movements: ____________ Start time: ____________ Finish time: ____________  °Date: ____________ Movements: ____________ Start time: ____________ Finish time: ____________ °Date: ____________ Movements: ____________ Start time: ____________ Finish time: ____________ °Date: ____________ Movements: ____________ Start time: ____________ Finish time: ____________ °Date: ____________ Movements: ____________ Start time: ____________ Finish time: ____________ °Date: ____________ Movements: ____________ Start time: ____________ Finish time: ____________ °Date: ____________ Movements:  ____________ Start time: ____________ Finish   time: ____________ °Date: ____________ Movements: ____________ Start time: ____________ Finish time: ____________  °Date: ____________ Movements: ____________ Start time: ____________ Finish time: ____________ °Date: ____________ Movements: ____________ Start time: ____________ Finish time: ____________ °Date: ____________ Movements: ____________ Start time: ____________ Finish time: ____________ °Date: ____________ Movements: ____________ Start time: ____________ Finish time: ____________ °Date: ____________ Movements: ____________ Start time: ____________ Finish time: ____________ °Date: ____________ Movements: ____________ Start time: ____________ Finish time: ____________ °Date: ____________ Movements: ____________ Start time: ____________ Finish time: ____________  °Date: ____________ Movements: ____________ Start time: ____________ Finish time: ____________ °Date: ____________ Movements: ____________ Start time: ____________ Finish time: ____________ °Date: ____________ Movements: ____________ Start time: ____________ Finish time: ____________ °Date: ____________ Movements: ____________ Start time: ____________ Finish time: ____________ °Date: ____________ Movements: ____________ Start time: ____________ Finish time: ____________ °Date: ____________ Movements: ____________ Start time: ____________ Finish time: ____________ °Date: ____________ Movements: ____________ Start time: ____________ Finish time: ____________  °Date: ____________ Movements: ____________ Start time: ____________ Finish time: ____________ °Date: ____________ Movements: ____________ Start time: ____________ Finish time: ____________ °Date: ____________ Movements: ____________ Start time: ____________ Finish time: ____________ °Date: ____________ Movements: ____________ Start time: ____________ Finish time: ____________ °Date: ____________ Movements: ____________ Start time: ____________ Finish  time: ____________ °Date: ____________ Movements: ____________ Start time: ____________ Finish time: ____________ °Date: ____________ Movements: ____________ Start time: ____________ Finish time: ____________  °Date: ____________ Movements: ____________ Start time: ____________ Finish time: ____________ °Date: ____________ Movements: ____________ Start time: ____________ Finish time: ____________ °Date: ____________ Movements: ____________ Start time: ____________ Finish time: ____________ °Date: ____________ Movements: ____________ Start time: ____________ Finish time: ____________ °Date: ____________ Movements: ____________ Start time: ____________ Finish time: ____________ °Date: ____________ Movements: ____________ Start time: ____________ Finish time: ____________ °  °This information is not intended to replace advice given to you by your health care provider. Make sure you discuss any questions you have with your health care provider. °  °Document Released: 05/22/2006 Document Revised: 05/13/2014 Document Reviewed: 02/17/2012 °Elsevier Interactive Patient Education ©2016 Elsevier Inc. ° °

## 2015-10-31 NOTE — Progress Notes (Signed)
Subjective:  Hailey Chambers is a 40 y.o. G5P4003 at 40 w6d being seen today for ongoing prenatal care.  She is currently monitored for the following issues for this high-risk pregnancy and has Abdominal pain, chronic, epigastric; Elevated BP; Sickle cell trait (HCC); Supervision of normal pregnancy; AMA (advanced maternal age) multigravida 40+; Language barrier; Fetal polydactyly affecting antepartum care of mother; and Anemia affecting pregnancy, antepartum on her problem list.  Patient reports occasional contractions.  Contractions: Irregular. Vag. Bleeding: None.  Movement: Present. Denies leaking of fluid.   The following portions of the patient's history were reviewed and updated as appropriate: allergies, current medications, past family history, past medical history, past social history, past surgical history and problem list. Problem list updated.  Objective:   Filed Vitals:   10/31/15 0942  BP: 109/59  Pulse: 63  Weight: 128 lb 11.2 oz (58.378 kg)    Fetal Status: Fetal Heart Rate (bpm): 135   Movement: Present     General:  Alert, oriented and cooperative. Patient is in no acute distress.  Skin: Skin is warm and dry. No rash noted.   Cardiovascular: Normal heart rate noted  Respiratory: Normal respiratory effort, no problems with respiration noted  Abdomen: Soft, gravid, appropriate for gestational age. Pain/Pressure: Present   FH 37 cm  Pelvic: Cervical exam performed      1/thick/ballotable  Extremities: Normal range of motion.  Edema: Mild pitting, slight indentation  Mental Status: Normal mood and affect. Normal behavior. Normal judgment and thought content.   Urinalysis: Urine Protein: Negative Urine Glucose: Negative  Assessment and Plan:  Pregnancy: G5P4003 at 40 w6d  1. Language barrier Interpreter at bedside- Swahili  2. Supervision of normal pregnancy, third trimester   Term labor symptoms and general obstetric precautions including but not limited to vaginal  bleeding, contractions, leaking of fluid and fetal movement were reviewed in detail with the patient. Please refer to After Visit Summary for other counseling recommendations.  Return in about 1 week (around 11/07/2015) for Routine OB.   Judeth HornErin Donni Oglesby, NP

## 2015-11-03 ENCOUNTER — Encounter (HOSPITAL_COMMUNITY): Payer: Self-pay | Admitting: *Deleted

## 2015-11-03 ENCOUNTER — Inpatient Hospital Stay (HOSPITAL_COMMUNITY)
Admission: AD | Admit: 2015-11-03 | Discharge: 2015-11-05 | DRG: 775 | Disposition: A | Discharge status: Home / Self Care | Payer: Medicaid Other | Source: Ambulatory Visit | Attending: Obstetrics & Gynecology | Admitting: Obstetrics & Gynecology

## 2015-11-03 DIAGNOSIS — D573 Sickle-cell trait: Secondary | ICD-10-CM | POA: Diagnosis present

## 2015-11-03 DIAGNOSIS — Z8249 Family history of ischemic heart disease and other diseases of the circulatory system: Secondary | ICD-10-CM | POA: Diagnosis not present

## 2015-11-03 DIAGNOSIS — IMO0001 Reserved for inherently not codable concepts without codable children: Secondary | ICD-10-CM

## 2015-11-03 DIAGNOSIS — O9902 Anemia complicating childbirth: Principal | ICD-10-CM | POA: Diagnosis present

## 2015-11-03 DIAGNOSIS — Z3A39 39 weeks gestation of pregnancy: Secondary | ICD-10-CM | POA: Diagnosis not present

## 2015-11-03 DIAGNOSIS — O99019 Anemia complicating pregnancy, unspecified trimester: Secondary | ICD-10-CM

## 2015-11-03 LAB — CBC
HCT: 30.3 % — ABNORMAL LOW (ref 36.0–46.0)
Hemoglobin: 10.3 g/dL — ABNORMAL LOW (ref 12.0–15.0)
MCH: 24.1 pg — AB (ref 26.0–34.0)
MCHC: 34 g/dL (ref 30.0–36.0)
MCV: 70.8 fL — ABNORMAL LOW (ref 78.0–100.0)
PLATELETS: 187 10*3/uL (ref 150–400)
RBC: 4.28 MIL/uL (ref 3.87–5.11)
RDW: 16.9 % — AB (ref 11.5–15.5)
WBC: 14 10*3/uL — AB (ref 4.0–10.5)

## 2015-11-03 LAB — TYPE AND SCREEN
ABO/RH(D): A POS
Antibody Screen: NEGATIVE

## 2015-11-03 LAB — ABO/RH: ABO/RH(D): A POS

## 2015-11-03 MED ORDER — OXYTOCIN 40 UNITS IN LACTATED RINGERS INFUSION - SIMPLE MED: 2.5000 [IU]/h | INTRAVENOUS | Status: DC

## 2015-11-03 MED ORDER — ONDANSETRON HCL 4 MG PO TABS: 4.0000 mg | ORAL_TABLET | ORAL | Status: DC | PRN

## 2015-11-03 MED ORDER — BENZOCAINE-MENTHOL 20-0.5 % EX AERO: 1.0000 "application " | INHALATION_SPRAY | CUTANEOUS | Status: DC | PRN

## 2015-11-03 MED ORDER — ACETAMINOPHEN 325 MG PO TABS: 650.0000 mg | ORAL_TABLET | ORAL | Status: DC | PRN

## 2015-11-03 MED ORDER — SIMETHICONE 80 MG PO CHEW: 80.0000 mg | CHEWABLE_TABLET | ORAL | Status: DC | PRN

## 2015-11-03 MED ORDER — DIBUCAINE 1 % RE OINT: 1.0000 "application " | TOPICAL_OINTMENT | RECTAL | Status: DC | PRN

## 2015-11-03 MED ORDER — PRENATAL MULTIVITAMIN CH
1.0000 | ORAL_TABLET | Freq: Every day | ORAL | Status: DC
  Administered 2015-11-04 – 2015-11-05 (×2): 1 via ORAL
  Filled 2015-11-03 (×2): qty 1

## 2015-11-03 MED ORDER — WITCH HAZEL-GLYCERIN EX PADS
1.0000 | MEDICATED_PAD | CUTANEOUS | Status: DC | PRN
Start: 2015-11-03 — End: 2015-11-05

## 2015-11-03 MED ORDER — OXYTOCIN BOLUS FROM INFUSION: 500.0000 mL | INTRAVENOUS | Status: DC

## 2015-11-03 MED ORDER — IBUPROFEN 600 MG PO TABS
600.0000 mg | ORAL_TABLET | Freq: Four times a day (QID) | ORAL | Status: DC
  Administered 2015-11-03 – 2015-11-05 (×7): 600 mg via ORAL
  Filled 2015-11-03 (×7): qty 1

## 2015-11-03 MED ORDER — COCONUT OIL OIL: 1.0000 "application " | TOPICAL_OIL | Status: DC | PRN

## 2015-11-03 MED ORDER — SENNOSIDES-DOCUSATE SODIUM 8.6-50 MG PO TABS
2.0000 | ORAL_TABLET | ORAL | Status: DC
  Administered 2015-11-04 – 2015-11-05 (×2): 2 via ORAL
  Filled 2015-11-03 (×2): qty 2

## 2015-11-03 MED ORDER — LIDOCAINE HCL (PF) 1 % IJ SOLN
30.0000 mL | INTRAMUSCULAR | Status: DC | PRN
  Filled 2015-11-03: qty 30

## 2015-11-03 MED ORDER — ONDANSETRON HCL 4 MG/2ML IJ SOLN: 4.0000 mg | Freq: Four times a day (QID) | INTRAMUSCULAR | Status: DC | PRN

## 2015-11-03 MED ORDER — ZOLPIDEM TARTRATE 5 MG PO TABS: 5.0000 mg | ORAL_TABLET | Freq: Every evening | ORAL | Status: DC | PRN

## 2015-11-03 MED ORDER — LACTATED RINGERS IV SOLN: 500.0000 mL | INTRAVENOUS | Status: DC | PRN

## 2015-11-03 MED ORDER — LACTATED RINGERS IV SOLN
INTRAVENOUS | Status: DC
  Administered 2015-11-03: 19:00:00 via INTRAVENOUS

## 2015-11-03 MED ORDER — OXYTOCIN 40 UNITS IN LACTATED RINGERS INFUSION - SIMPLE MED
INTRAVENOUS | Status: AC
  Filled 2015-11-03: qty 1000

## 2015-11-03 MED ORDER — LIDOCAINE HCL (PF) 1 % IJ SOLN
INTRAMUSCULAR | Status: AC
  Filled 2015-11-03: qty 30

## 2015-11-03 MED ORDER — DIPHENHYDRAMINE HCL 25 MG PO CAPS: 25.0000 mg | ORAL_CAPSULE | Freq: Four times a day (QID) | ORAL | Status: DC | PRN

## 2015-11-03 MED ORDER — ONDANSETRON HCL 4 MG/2ML IJ SOLN: 4.0000 mg | INTRAMUSCULAR | Status: DC | PRN

## 2015-11-03 MED ORDER — TETANUS-DIPHTH-ACELL PERTUSSIS 5-2.5-18.5 LF-MCG/0.5 IM SUSP: 0.5000 mL | Freq: Once | INTRAMUSCULAR | Status: DC

## 2015-11-03 MED ORDER — SOD CITRATE-CITRIC ACID 500-334 MG/5ML PO SOLN: 30.0000 mL | ORAL | Status: DC | PRN

## 2015-11-03 MED ORDER — OXYTOCIN 10 UNIT/ML IJ SOLN
INTRAMUSCULAR | Status: AC
  Administered 2015-11-03: 10 [IU]
  Filled 2015-11-03: qty 1

## 2015-11-03 NOTE — MAU Note (Signed)
Dr. Ashok PallWouk in to see patient. [employer] interpretor on line. Patient started pushing involuntarily while IV being started.

## 2015-11-03 NOTE — MAU Note (Addendum)
Contractions, arrived via EMS

## 2015-11-03 NOTE — H&P (Signed)
LABOR AND DELIVERY ADMISSION HISTORY AND PHYSICAL NOTE  Hailey Chambers is a 40 y.o. female G5P4003 with IUP at 7439[name] by L/12 wk u/s presenting for painful contractions.   She reports positive fetal movement. She denies leakage of fluid or vaginal bleeding.  Prenatal History/Complications:  Past Medical History: Past Medical History  Diagnosis Date  . Positive H. pylori test 06/03/2014    Past Surgical History: Past Surgical History  Procedure Laterality Date  . No past surgeries      Obstetrical History: OB History    Gravida Para Term Preterm AB TAB SAB Ectopic Multiple Living   5 4 4  0 0 0 0 0 0 3      Social History: Social History   Social History  . Marital Status: Married    Spouse Name: N/A  . Number of Children: 3  . Years of Education: N/A   Social History Main Topics  . Smoking status: Never Smoker   . Smokeless tobacco: Never Used  . Alcohol Use: No  . Drug Use: No  . Sexual Activity: Yes    Birth Control/ Protection: Other-see comments, Condom     Comment: calendar and condoms    Other Topics Concern  . None   Social History Narrative   From [place]   [place] then [place]    Lives with husband and 3 children (had 4 children but one died young).   Moved to [place] in 02/2014    Family History: Family History  Problem Relation Age of Onset  . Hypertension Mother   . Diabetes Neg Hx   . Cancer Neg Hx   . Heart disease Neg Hx     Allergies: No Known Allergies  Prescriptions prior to admission  Medication Sig Dispense Refill Last Dose  . Elastic Bandages & Supports (COMFORT FIT MATERNITY SUPP MED) MISC 1 Device by Does not apply route daily. (Patient not taking: Reported on 10/24/2015) 1 each 0 Not Taking  . Elastic Bandages & Supports (T.E.D. KNEE LENGTH/S-REGULAR) MISC 1 Device by Does not apply route daily. (Patient not taking: Reported on 10/24/2015) 1 each 1 Not Taking  . ferrous sulfate (FERROUSUL) 325 (65 FE) MG tablet  Take 1 tablet (325 mg total) by mouth 2 (two) times daily. 60 tablet 1 Taking  . Prenatal Multivit-Min-Fe-FA (PRENATAL VITAMINS) 0.8 MG tablet Take 1 tablet by mouth daily. 30 tablet 12 Taking     Review of Systems   All systems reviewed and negative except as stated in HPI  Blood pressure 129/62, pulse 77, temperature 98.1 F (36.7 C), temperature source Oral, resp. rate 20, last menstrual period 02/01/2015. General appearance: alert, cooperative, appears stated age and moderate distress Lungs: clear to auscultation bilaterally Heart: regular rate and rhythm Abdomen: soft, non-tender; bowel sounds normal Extremities: No calf swelling or tenderness Presentation: cephalic per rn exam Fetal monitoring: 140/mod/+a/-d Uterine activity: q 3 min  Dilation: 6 Effacement (%): 70 Station: -2 Exam by:: Hailey Chambers, RNC   Prenatal labs: ABO, Rh: A/POS/-- (12/14 1339) Antibody: NEG (12/14 1339) Rubella: !Error!imm RPR: NON REAC (04/19 1138)  HBsAg: NEGATIVE (12/14 1339)  HIV: NONREACTIVE (04/19 1138)  GBS: Negative (06/06 0000)  1 hr Glucola: 146; 3 hr 66/151/101/95 Genetic screening:  Quad wnl Anatomy US: wnl save for polydactyly  Prenatal Transfer Tool  Maternal Diabetes: No Genetic Screening: Normal Maternal Ultrasounds/Referrals: Abnormal:  Findings:   Other:polydactyly Fetal Ultrasounds or other Referrals:  None Maternal Substance Abuse:  No Significant  Maternal Medications:  None Significant Maternal Lab Results: Lab values include: Group B Strep negative  No results found for this or any previous visit (from the past 24 hour(s)).  Patient Active Problem List   Diagnosis Date Noted  . Anemia affecting pregnancy, antepartum 08/31/2015  . Language barrier 08/24/2015  . Fetal polydactyly affecting antepartum care of mother 08/24/2015  . Supervision of normal pregnancy 05/31/2015  . AMA (advanced maternal age) multigravida 35+ 05/31/2015  . Sickle cell trait (HCC)  04/25/2015  . Elevated BP 09/22/2014  . Abdominal pain, chronic, epigastric 06/02/2014    Assessment: Hailey Chambers is a 40 y.o. G5P4003 at 5560w2d here for active labor.  #Labor: expectant #Pain: Non-pharm #FWB: Cat 1 #ID:  gbs neg #MOF: breast #MOC: need to discuss #Circ:  Yes, outpt #SST, polydactyly: peds notified  Hailey Chambers 11/03/2015, 7:18 PM

## 2015-11-04 LAB — RPR: RPR Ser Ql: NONREACTIVE

## 2015-11-04 NOTE — Progress Notes (Signed)
Interpreter number [id number] used to admit, explained test and procedures to pt

## 2015-11-04 NOTE — Progress Notes (Signed)
Post Partum Day #1 Subjective: no complaints and tolerating PO; breastfeeding going well; undecided re contraception  Objective: Blood pressure 104/64, pulse 63, temperature 98.7 F (37.1 C), temperature source Oral, resp. rate 18, last menstrual period 02/01/2015, SpO2 100 %, unknown if currently breastfeeding.  Physical Exam:  General: alert, cooperative and no distress Lochia: appropriate Uterine Fundus: firm DVT Evaluation: No evidence of DVT seen on physical exam.   Recent Labs  11/03/15 1920  HGB 10.3*  HCT 30.3*    Assessment/Plan: Plan for discharge tomorrow   LOS: 1 day   Cam HaiSHAW, Dashiel Bergquist CNM 11/04/2015, 9:23 AM

## 2015-11-04 NOTE — Progress Notes (Signed)
[employer] interpreter [phone] used to discuss plan of care regarding patient and infant. No questions or concerns at this time. Instructed to call for assistance as needed.

## 2015-11-04 NOTE — Lactation Note (Signed)
This note was copied from a baby's chart. Lactation Consultation Note  Cammie McgeeRuth [employer] interpreter [phone]#[id number] on phone for my visit. Experienced BF mom holding sleeping baby after bath. Reports he last fed about 1 hour ago for 15 min. Does reports some pain with latch. Reviewed wide open mouth and keeping the baby close to the breast throughout the feeding. Reviewed feeding cues and encouraged to feed whenever she sees them. No questions at present. To call for assist prn  Patient Name: Hailey Chambers [id number]'s Date: 11/04/2015 Reason for consult: Initial assessment   Maternal Data Formula Feeding for Exclusion: No Does the patient have breastfeeding experience prior to this delivery?: Yes  Feeding    LATCH Score/Interventions                      Lactation Tools Discussed/Used     Consult Status Consult Status: PRN    Pamelia HoitWeeks, Makyra Corprew D 11/04/2015, 2:57 PM

## 2015-11-05 MED ORDER — IBUPROFEN 600 MG PO TABS: 600.0000 mg | ORAL_TABLET | Freq: Four times a day (QID) | ORAL | Status: DC

## 2015-11-05 NOTE — Progress Notes (Signed)
[employer] Interpreter [phone]#[id number] used for discharge instructions

## 2015-11-05 NOTE — Discharge Summary (Signed)
OB Discharge Summary  Patient Name: Hailey Chambers DOB: 07/26/1975 MRN: [id number]  Date of admission: 11/03/2015 Delivering MD: Shonna ChockWOUK, NOAH BEDFORD   Date of discharge: 11/05/2015  Admitting diagnosis: 40w labor check Intrauterine pregnancy: 8055w2d     Secondary diagnosis:Active Problems:   Active labor at term  Additional problems:none     Discharge diagnosis: Term Pregnancy Delivered                                                                     Post partum procedures:none  Augmentation: none  Complications: None  Hospital course:  Onset of Labor With Vaginal Delivery     40 y.o. yo G5P5004 at 7855w2d was admitted in Active Labor on 11/03/2015. Patient had an uncomplicated labor course as follows:  Membrane Rupture Time/Date: 7:30 PM ,11/03/2015   Intrapartum Procedures: Episiotomy: None [1]                                         Lacerations:  None [1]  Patient had a delivery of a Viable infant. 11/03/2015  Information for the patient's newborn:  Hailey Chambers, Boy Hailey Chambers [[id number]]  Delivery Method: Vaginal, Spontaneous Delivery (Filed from Delivery Summary)    Pateint had an uncomplicated postpartum course.  She is ambulating, tolerating a regular diet, passing flatus, and urinating well. Patient is discharged home in stable condition on 11/05/2015.    Physical exam  Filed Vitals:   11/04/15 0537 11/04/15 1117 11/04/15 1900 11/05/15 0543  BP: 104/64 103/62 121/67 95/55  Pulse: 63 74 70 64  Temp: 98.7 F (37.1 C) 98.9 F (37.2 C) 98.4 F (36.9 C) 97.8 F (36.6 C)  TempSrc:  Oral Oral Oral  Resp: 18 18 18 16   SpO2:       General: alert, cooperative and no distress Lochia: appropriate Uterine Fundus: firm Incision: N/A DVT Evaluation: Negative Homan's sign. No cords or calf tenderness. No significant calf/ankle edema. Labs: Lab Results  Component Value Date   WBC 14.0* 11/03/2015   HGB 10.3* 11/03/2015   HCT 30.3* 11/03/2015   MCV 70.8*  11/03/2015   PLT 187 11/03/2015   CMP Latest Ref Rng 09/26/2015  Glucose 65 - 104 mg/dL 66  BUN 6 - 23 mg/dL -  Creatinine 7.820.50 - 9.561.10 mg/dL -  Sodium 213135 - 086145 mEq/L -  Potassium 3.5 - 5.3 mEq/L -  Chloride 96 - 112 mEq/L -  CO2 19 - 32 mEq/L -  Calcium 8.4 - 10.5 mg/dL -  Total Protein 6.0 - 8.3 g/dL -  Total Bilirubin 0.2 - 1.2 mg/dL -  Alkaline Phos 39 - 578117 U/L -  AST 0 - 37 U/L -  ALT 0 - 35 U/L -    Discharge instruction: per After Visit Summary and "Baby and Me Booklet".  After Visit Meds:    Medication List    ASK your doctor about these medications        Prenatal Vitamins 0.8 MG tablet  Take 1 tablet by mouth daily.     T.E.D. KNEE LENGTH/S-REGULAR Misc  1 Device by Does not apply route daily.  COMFORT FIT MATERNITY SUPP MED Misc  1 Device by Does not apply route daily.        Diet: routine diet  Activity: Advance as tolerated. Pelvic rest for 6 weeks.   Outpatient follow up:6 weeks Follow up Appt:No future appointments. Follow up visit: No Follow-up on file.  Postpartum contraception: declines contraception  Newborn Data: Live born female  Birth Weight: 5 lb 14.9 oz (2690 g) APGAR: 6, 9  Baby Feeding: Breast Disposition:home with mother   11/05/2015 Hailey Chambers, CNM

## 2016-01-02 ENCOUNTER — Ambulatory Visit: Payer: Medicaid Other | Admitting: Advanced Practice Midwife

## 2016-06-30 ENCOUNTER — Emergency Department (HOSPITAL_COMMUNITY)
Admission: EM | Admit: 2016-06-30 | Discharge: 2016-06-30 | Disposition: A | Discharge status: Home / Self Care | Payer: Self-pay | Attending: Emergency Medicine | Admitting: Emergency Medicine

## 2016-06-30 ENCOUNTER — Emergency Department (HOSPITAL_COMMUNITY): Payer: Self-pay

## 2016-06-30 ENCOUNTER — Encounter (HOSPITAL_COMMUNITY): Payer: Self-pay | Admitting: Emergency Medicine

## 2016-06-30 DIAGNOSIS — Y929 Unspecified place or not applicable: Secondary | ICD-10-CM | POA: Insufficient documentation

## 2016-06-30 DIAGNOSIS — Y999 Unspecified external cause status: Secondary | ICD-10-CM | POA: Insufficient documentation

## 2016-06-30 DIAGNOSIS — S60222A Contusion of left hand, initial encounter: Secondary | ICD-10-CM | POA: Insufficient documentation

## 2016-06-30 DIAGNOSIS — Y939 Activity, unspecified: Secondary | ICD-10-CM | POA: Insufficient documentation

## 2016-06-30 DIAGNOSIS — S63502A Unspecified sprain of left wrist, initial encounter: Secondary | ICD-10-CM | POA: Insufficient documentation

## 2016-06-30 MED ORDER — IBUPROFEN 800 MG PO TABS
800.0000 mg | ORAL_TABLET | Freq: Three times a day (TID) | ORAL | 0 refills | Status: DC
Start: 2016-06-30 — End: 2019-07-15
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
## 2016-06-30 NOTE — Discharge Instructions (Signed)
Return if any problems.

## 2016-06-30 NOTE — ED Notes (Signed)
Declined W/C at D/C and was escorted to lobby by RN. 
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
## 2016-09-18 ENCOUNTER — Encounter: Payer: Self-pay | Admitting: Family Medicine

## 2016-10-04 DIAGNOSIS — Z1211 Encounter for screening for malignant neoplasm of colon: Secondary | ICD-10-CM

## 2016-10-04 DIAGNOSIS — F439 Reaction to severe stress, unspecified: Secondary | ICD-10-CM

## 2016-10-04 LAB — GLUCOSE, POCT (MANUAL RESULT ENTRY): POC Glucose: 77 mg/dl (ref 70–99)

## 2016-10-04 NOTE — Congregational Nurse Program (Signed)
Congregational Nurse Program Note  Date of Encounter: 10/04/2016  Past Medical History: Past Medical History:  Diagnosis Date  . Positive H. pylori test 06/03/2014    Encounter Details:     CNP Questionnaire - 10/04/16 1359      Patient Demographics   Is this a new or existing patient? Existing   Patient is considered a/an Refugee   Race African     Patient Assistance   Location of Patient Assistance [place]   Uninsured Patient (Orange Card/Care Connects) No   Patient referred to apply for the following financial assistance [employer]ista[employer]/Care Connects   Food insecurities addressed Not Technical brewerApplicable   Transportation assistance No   Assistance securing medications No   Product/process development scientistducational health offerings Navigating the healthcare system     Encounter Details   Primary purpose of visit Education/Health Concerns;Navigating the Healthcare System;Post ED/Hospitalization Visit   Was an Emergency Department visit averted? Not Applicable   Does patient have a medical provider? Yes   Was a mental health screening completed? (GAINS tool) No   Does patient have dental issues? No   Does patient have vision issues? No   Does your patient have an abnormal blood pressure today? Yes   Since previous encounter, have you referred patient for abnormal blood pressure that resulted in a new diagnosis or medication change? No   Does your patient have an abnormal blood glucose today? No   Since previous encounter, have you referred patient for abnormal blood glucose that resulted in a new diagnosis or medication change? No   Was there a life-saving intervention made? No     Client stopped by community center with c/o stress related to unpaid medical bill.client is uninsured and information was provided on orange card application.Client requested blood sugar check and was reassured that it was within normal limits.Client advised on stress management, health literacy and how to understand medical  bills.Client will come back for blood pressure checks. Nicole Cellaorothy Muhoro RN BSN PCCN CNP. [phone]

## 2016-11-22 NOTE — Congregational Nurse Program (Signed)
Congregational Nurse Program Note  Date of Encounter: 11/22/2016  Past Medical History: Past Medical History:  Diagnosis Date  . Positive H. pylori test 06/03/2014    Encounter Details:     CNP Questionnaire - 11/22/16 1400      Patient Demographics   Is this a new or existing patient? Existing   Patient is considered a/an Refugee   Race African     Patient Assistance   Location of Patient Assistance [place]   Patient's financial/insurance status Self-Pay (Uninsured)   Uninsured Patient (Orange Card/Care Connects) Yes   Interventions Counseled to make appt. with provider   Patient referred to apply for the following financial assistance [employer] Card/Care Connects   Food insecurities addressed Not Applicable   Transportation assistance No   Assistance securing medications No   Product/process development scientistducational health offerings Navigating the healthcare system;Health literacy;Exercise/physical activity;Nutrition     Encounter Details   Primary purpose of visit Other  Blood pressur echeck   Was an Emergency Department visit averted? Not Applicable   Does patient have a medical provider? No   Patient referred to Other  does not want PCP untill after orange card application   Was a mental health screening completed? (GAINS tool) No   Does patient have dental issues? No   Does patient have vision issues? No   Does your patient have an abnormal blood pressure today? No   Since previous encounter, have you referred patient for abnormal blood pressure that resulted in a new diagnosis or medication change? No   Does your patient have an abnormal blood glucose today? No   Since previous encounter, have you referred patient for abnormal blood glucose that resulted in a new diagnosis or medication change? No   Was there a life-saving intervention made? No    client stopped by for blood pressure check and also for assistance with making an appointment for her 41 year old son Hailey Chambers.Same done with  [facility] and will be seen Monday July 23rd at [facility]11am. Hailey Klinger RN BSN PCCN CNP. [phone]

## 2017-07-26 IMAGING — US US MFM OB FOLLOW-UP
1 series · 14 of 28 positions shown · non-contrast
Comparison: none

[Series 1: us mfm ob follow-up · 14 of 34 slices shown]
[im 2/34]
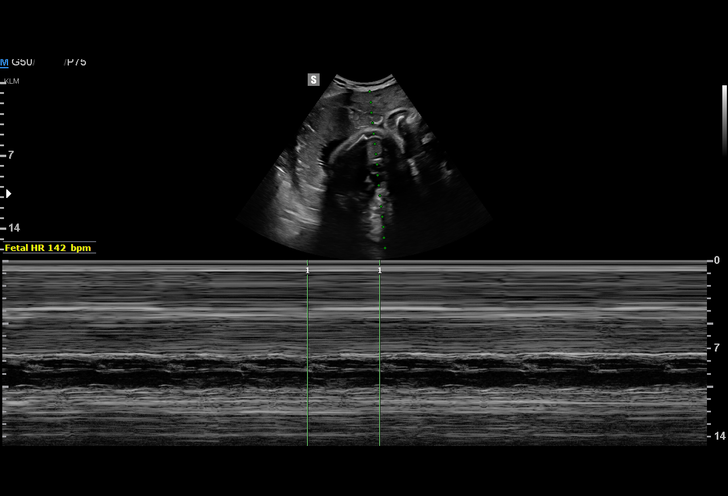
[im 4/34]
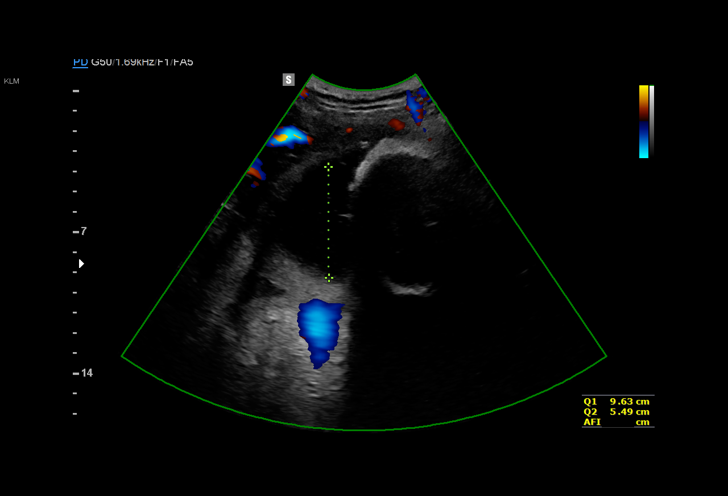
[im 7/34]
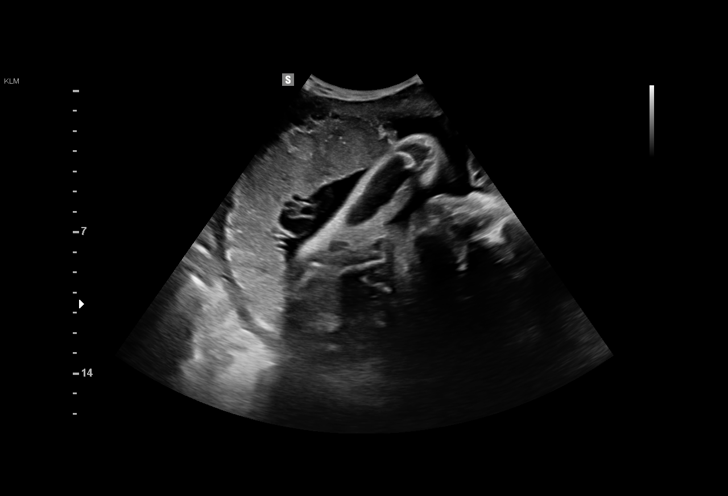
[im 9/34]
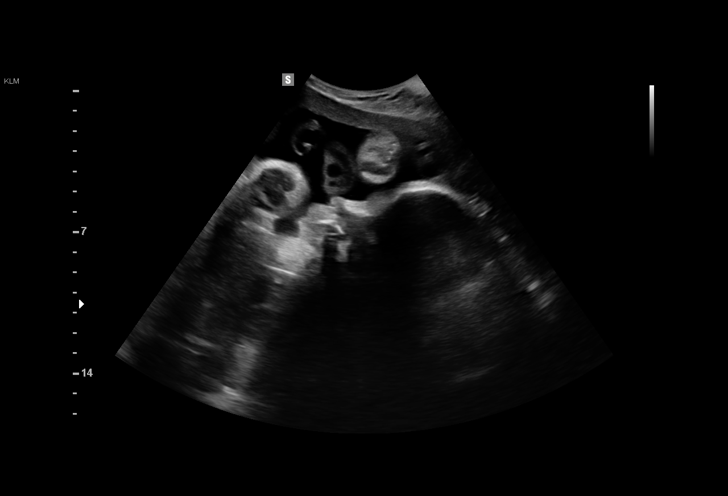
[im 12/34]
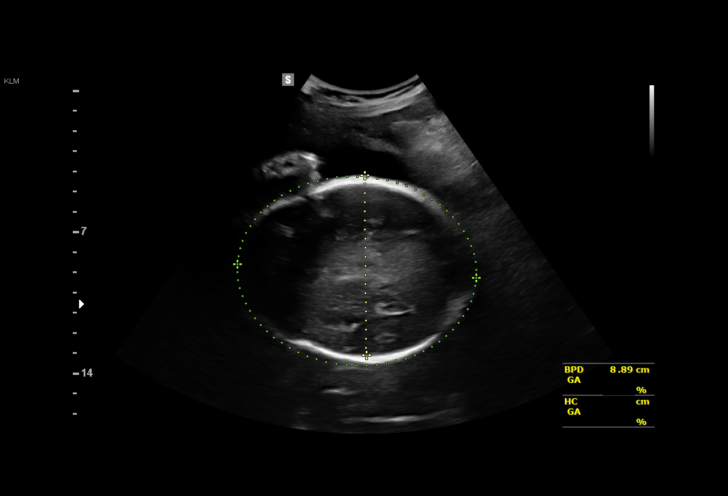
[im 14/34]
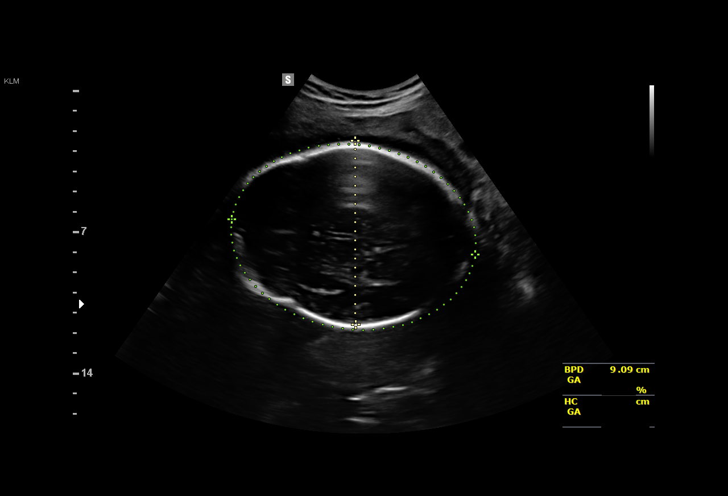
[im 16/34]
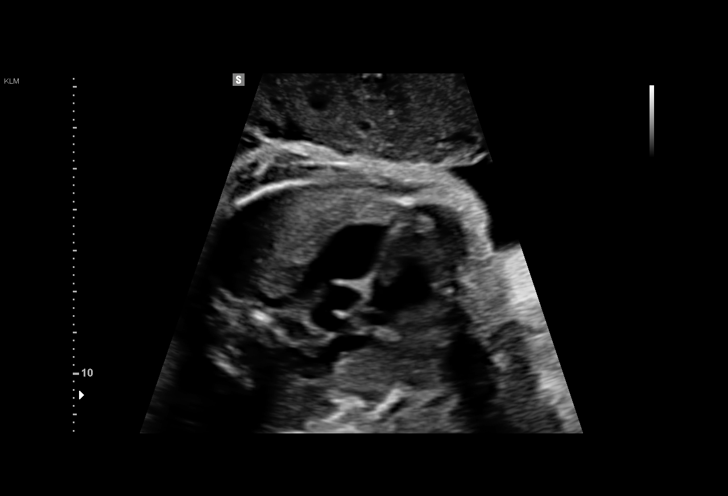
[im 19/34]
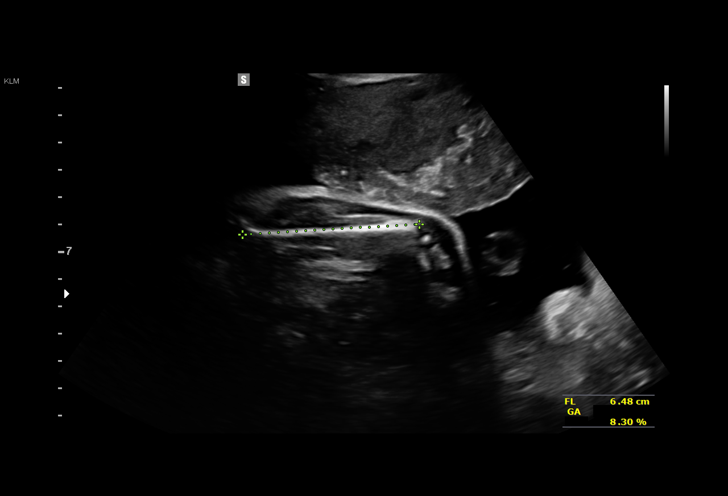
[im 21/34]
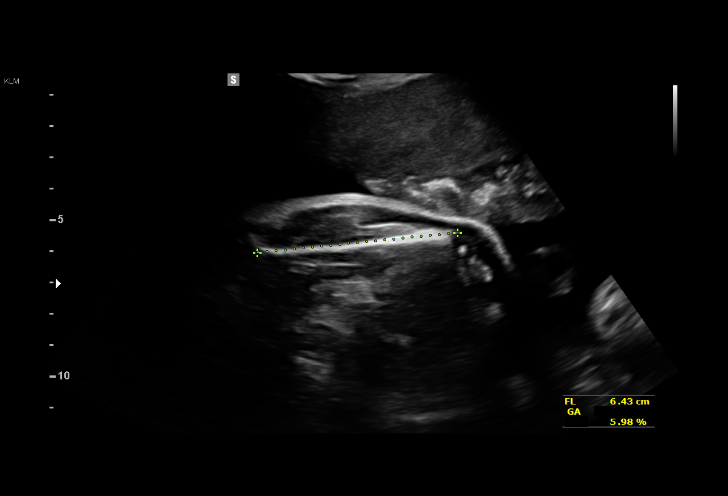
[im 24/34]
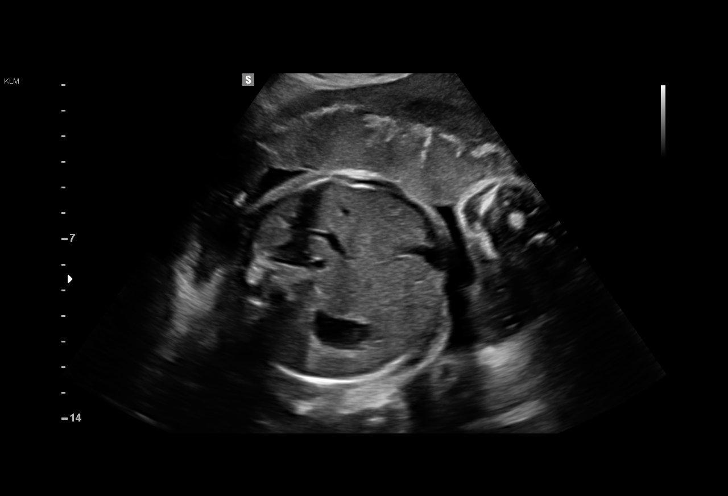
[im 26/34]
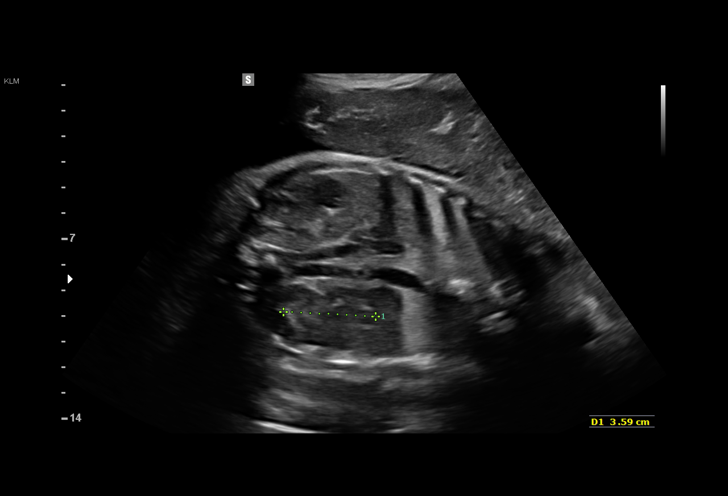
[im 29/34]
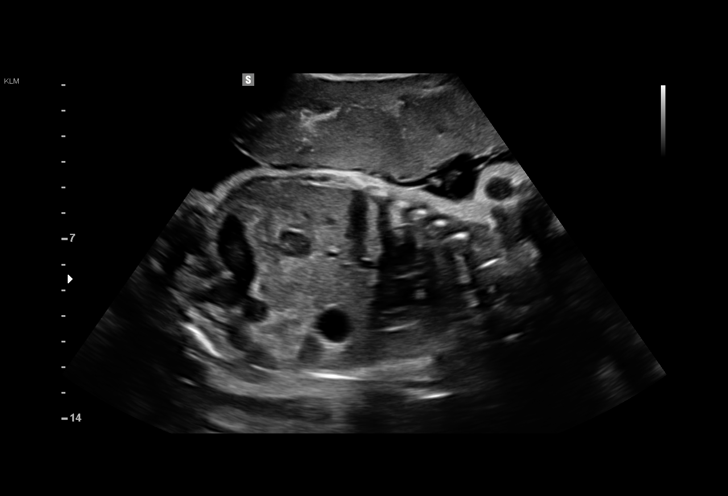
[im 31/34]
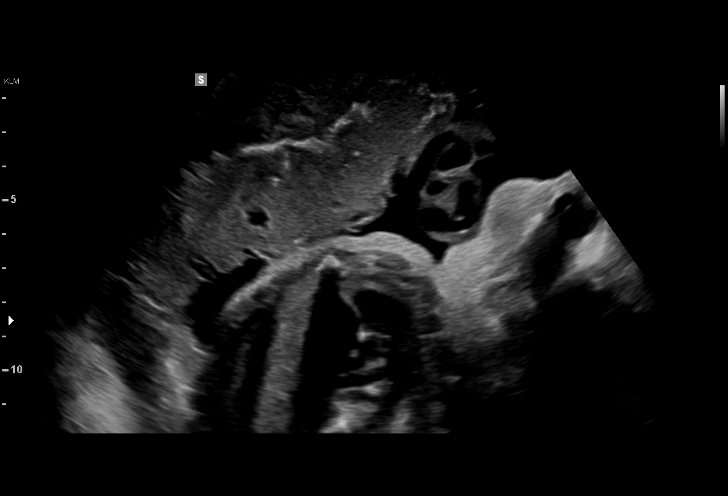
[im 34/34]
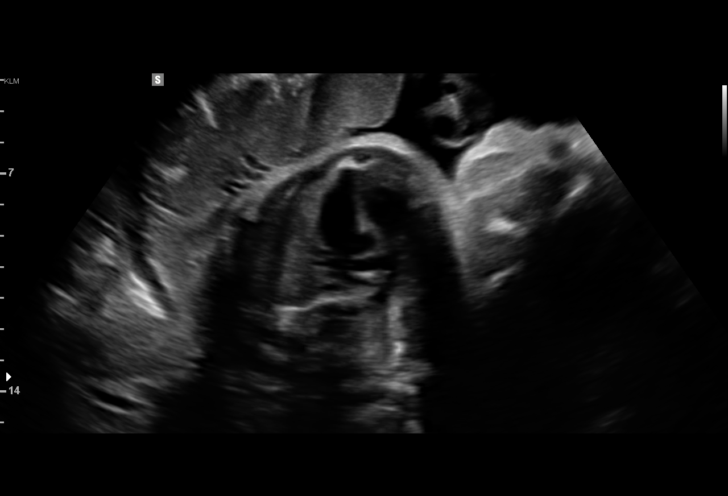

[14 of 28 positions shown; findings below may reference images not displayed]

Faculty Practice
LB CNM

Indications

35 weeks gestation of pregnancy
Advanced maternal age multigravida
35+(39), second trimester; low risk quad
screen
History of sickle cell trait
Fetal polydactyly
Follow-up incomplete fetal anatomic            Z36
evaluation
OB History

Gravidity:    5         Term:   4        Prem:   0        SAB:   0
TOP:          0       Ectopic:  0        Living: 3
Fetal Evaluation

Num Of Fetuses:     1
Fetal Heart         142
Rate(bpm):
Cardiac Activity:   Observed
Presentation:       Transverse, head to maternal left
Placenta:           Fundal, above cervical os
P. Cord Insertion:  Visualized

Amniotic Fluid
AFI FV:      Subjectively within normal limits

AFI Sum(cm)     %Tile       Largest Pocket(cm)
20.55           77
RUQ(cm)       RLQ(cm)       LUQ(cm)        LLQ(cm)
9.63
Biometry

BPD:      88.9  mm     G. Age:  36w 0d         76  %    CI:        71.49   %   70 - 86
FL/HC:      19.6   %   20.1 -
HC:      334.8  mm     G. Age:  38w 2d         88  %    HC/AC:      1.13       0.93 -
AC:       296   mm     G. Age:  33w 4d         17  %    FL/BPD:     73.9   %   71 - 87
FL:       65.7  mm     G. Age:  33w 6d         15  %    FL/AC:      22.2   %   20 - 24

Est. FW:    3254  gm      5 lb 5 oz     45  %
Gestational Age

U/S Today:     35w 3d                                        EDD:   11/04/15
Best:          35w 1d    Det. By:   Early Ultrasound         EDD:   11/06/15
(04/27/15)
Anatomy

Cranium:               Appears normal         LVOT:                   Appears normal
Cavum:                 Previously seen        Aortic Arch:            Previously seen
Ventricles:            Previously seen        Ductal Arch:            Previously seen
Choroid Plexus:        Previously seen        Diaphragm:              Appears normal
Cerebellum:            Previously seen        Stomach:                Appears normal, left
sided
Posterior Fossa:       Previously seen        Abdomen:                Appears normal
Nuchal Fold:           Previously seen        Abdominal Wall:         Previously seen
Face:                  Orbits and profile     Cord Vessels:           Previously seen
previously seen
Lips:                  Previously seen        Kidneys:                Appear normal
Palate:                Appears normal         Bladder:                Appears normal
Thoracic:              Appears normal         Spine:                  Limited views
previously seen
Heart:                 Appears normal         Upper Extremities:      Polydactyly seen
prev.
RVOT:                  Appears normal         Lower Extremities:      Not well visualized

Other:  Fetus appears to be a male. Technically difficult due to fetal position.
Cervix Uterus Adnexa

Cervix
Not visualized (advanced GA >22wks)
Impression

SIUP at 35+1 weeks
Normal interval anatomy; anatomic survey complete except
for details of Tabsum; polydactyly previously seen
Normal amniotic fluid volume
Appropriate interval growth with EFW at the 45th %tile
Recommendations

Follow-up as clinically indicated

## 2017-08-01 NOTE — Congregational Nurse Program (Signed)
Congregational Nurse Program Note  Date of Encounter: 08/01/2017  Past Medical History: Past Medical History:  Diagnosis Date  . Positive H. pylori test 06/03/2014    Encounter Details: CNP Questionnaire - 08/01/17 1525      Questionnaire   Patient Status  Refugee    Race  African    Location Patient Served At  Not Applicable    Insurance  Not Applicable    Uninsured  Uninsured (NEW 1x/quarter)    Food  No food insecurities    Housing/Utilities  Yes, have permanent housing    Transportation  No transportation needs    Interpersonal Safety  Yes, feel physically and emotionally safe where you currently live    Medication  Yes, have medication insecurities    Medical Provider  No    Referrals  Area Agency    ED Visit Averted  Not Applicable    Life-Saving Intervention Made  Not Applicable      Clinical Intake - 08/01/17 1525      Nutrition Screen   Nutritional Risks  None     Client came in for BP Check. Same done. Has no complains at this time. Nicole Cellaorothy Kiely Cousar RN BSN PCCN CNP [phone].

## 2017-12-22 ENCOUNTER — Telehealth: Payer: Self-pay

## 2017-12-22 NOTE — Telephone Encounter (Signed)
Phone call was so that she could get medication with understanding from pharmacy. I talked with pharmacy and they will assist her. I also sent a note with her, she had been there before and had a way to get there. Refill of mucinex over the counter for her child/ Md orders. 7 Taylor Alina Gilkey Makalynn Berwanger RN BSN CN [facility] PhD.

## 2018-11-17 ENCOUNTER — Emergency Department (HOSPITAL_COMMUNITY)
Admission: EM | Admit: 2018-11-17 | Discharge: 2018-11-17 | Disposition: A | Discharge status: Home / Self Care | Payer: Medicaid Other | Attending: Emergency Medicine | Admitting: Emergency Medicine

## 2018-11-17 ENCOUNTER — Encounter (HOSPITAL_COMMUNITY): Payer: Self-pay

## 2018-11-17 ENCOUNTER — Other Ambulatory Visit: Payer: Self-pay

## 2018-11-17 DIAGNOSIS — K0889 Other specified disorders of teeth and supporting structures: Secondary | ICD-10-CM | POA: Diagnosis present

## 2018-11-17 DIAGNOSIS — K047 Periapical abscess without sinus: Secondary | ICD-10-CM | POA: Insufficient documentation

## 2018-11-17 MED ORDER — DICLOFENAC SODIUM 75 MG PO TBEC: 75.0000 mg | DELAYED_RELEASE_TABLET | Freq: Two times a day (BID) | ORAL | 0 refills | Status: DC

## 2018-11-17 MED ORDER — AMOXICILLIN 500 MG PO CAPS: 500.0000 mg | ORAL_CAPSULE | Freq: Three times a day (TID) | ORAL | 0 refills | Status: DC

## 2018-11-17 MED FILL — AMOXICILLIN 500 MG CAPSULE: 500 | 7 days supply | Qty: 21 | Fill #0

## 2018-11-17 NOTE — ED Provider Notes (Signed)
[facility] EMERGENCY [facility]PARTMENT Provider Note   CSN: [id number] Arrival date & time: 11/17/18  1228     History   Chief Complaint Chief Complaint  Patient presents with  . Dental Pain    HPI Hailey Chambers is a 43 y.o. female.     The history is provided by the patient. No language interpreter was used.  Dental Pain Location:  Upper Quality:  Aching Severity:  Moderate Onset quality:  Gradual Progression:  Worsening Chronicity:  New Previous work-up:  Dental exam Relieved by:  Nothing Worsened by:  Nothing Ineffective treatments:  None tried Associated symptoms: facial pain     Past Medical History:  Diagnosis Date  . Positive H. pylori test 06/03/2014    Patient Active Problem List   Diagnosis Date Noted  . Active labor at term 11/03/2015  . Anemia affecting pregnancy, antepartum 08/31/2015  . Language barrier 08/24/2015  . Fetal polydactyly affecting antepartum care of mother 08/24/2015  . Supervision of normal pregnancy 05/31/2015  . AMA (advanced maternal age) multigravida 35+ 05/31/2015  . Sickle cell trait (HCC) 04/25/2015  . Elevated BP 09/22/2014  . Abdominal pain, chronic, epigastric 06/02/2014    Past Surgical History:  Procedure Laterality Date  . NO PAST SURGERIES       OB History    Gravida  5   Para  5   Term  5   Preterm  0   AB  0   Living  4     SAB  0   TAB  0   Ectopic  0   Multiple  0   Live Births  4            Home Medications    Prior to Admission medications   Medication Sig Start Date End Date Taking? Authorizing Provider  amoxicillin (AMOXIL) 500 MG capsule Take 1 capsule (500 mg total) by mouth 3 (three) times daily. 11/17/18   Elson AreasSofia, Rogan Wigley K, PA-C  diclofenac (VOLTAREN) 75 MG EC tablet Take 1 tablet (75 mg total) by mouth 2 (two) times daily. 11/17/18   Elson AreasSofia, Seila Liston K, PA-C  Elastic Bandages & Supports (COMFORT FIT MATERNITY SUPP MED) MISC 1 Device by Does not apply route  daily. Patient not taking: Reported on 10/24/2015 09/19/15   Hurshel PartyLeftwich-Kirby, Lisa A, CNM  Elastic Bandages & Supports (T.E.D. KNEE LENGTH/S-REGULAR) MISC 1 Device by Does not apply route daily. Patient not taking: Reported on 10/24/2015 09/19/15   Hurshel PartyLeftwich-Kirby, Lisa A, CNM  ibuprofen (ADVIL,MOTRIN) 800 MG tablet Take 1 tablet (800 mg total) by mouth 3 (three) times daily. 06/30/16   Elson AreasSofia, Jadiel Schmieder K, PA-C    Family History Family History  Problem Relation Age of Onset  . Hypertension Mother   . Diabetes Neg Hx   . Cancer Neg Hx   . Heart disease Neg Hx     Social History Social History   Tobacco Use  . Smoking status: Never Smoker  . Smokeless tobacco: Never Used  Substance Use Topics  . Alcohol use: No    Alcohol/week: 0.0 standard drinks  . Drug use: No     Allergies   Patient has no known allergies.   Review of Systems Review of Systems  All other systems reviewed and are negative.    Physical Exam Updated Vital Signs BP 139/73 (BP Location: Right Arm)   Pulse 66   Temp 97.9 F (36.6 C) (Oral)   Resp 18   SpO2 99%   Physical  Exam Vitals signs and nursing note reviewed.  Constitutional:      Appearance: She is well-developed.  HENT:     Head: Normocephalic.     Nose: Nose normal.     Mouth/Throat:     Mouth: Mucous membranes are moist.     Comments: Swelling around gum Neck:     Musculoskeletal: Normal range of motion.  Pulmonary:     Effort: Pulmonary effort is normal.  Abdominal:     General: There is no distension.  Musculoskeletal: Normal range of motion.  Skin:    General: Skin is warm.  Neurological:     General: No focal deficit present.     Mental Status: She is alert and oriented to person, place, and time.  Psychiatric:        Mood and Affect: Mood normal.      ED Treatments / Results  Labs (all labs ordered are listed, but only abnormal results are displayed) Labs Reviewed - No data to display  EKG None  Radiology No  results found.  Procedures Procedures (including critical care time)  Medications Ordered in ED Medications - No data to display   Initial Impression / Assessment and Plan / ED Course  I have reviewed the triage vital signs and the nursing notes.  Pertinent labs & imaging results that were available during my care of the patient were reviewed by me and considered in my medical decision making (see chart for details).        MDM  Pt advised to follow up for dental care.  Pt given number for Dr. Geralynn Ochs.   Final Clinical Impressions(s) / ED Diagnoses   Final diagnoses:  Dental infection    ED Discharge Orders         Ordered    amoxicillin (AMOXIL) 500 MG capsule  3 times daily     11/17/18 1315    diclofenac (VOLTAREN) 75 MG EC tablet  2 times daily     11/17/18 1315        An After Visit Summary was printed and given to the patient.    Fransico Meadow, [place] 11/17/18 1318    Virgel Manifold, MD 11/18/18 1007

## 2018-11-17 NOTE — ED Triage Notes (Signed)
Swahili interpreter used for triage. Pt reports right lower dental pain for 1 week.

## 2018-11-17 NOTE — Discharge Instructions (Signed)
Return if any problems.

## 2018-11-17 NOTE — ED Notes (Signed)
Patient verbalizes understanding of discharge instructions. Opportunity for questioning and answers were provided. Armband removed by staff, pt discharged from ED.  

## 2018-11-20 ENCOUNTER — Telehealth: Payer: Self-pay

## 2018-11-20 NOTE — Telephone Encounter (Signed)
Hailey  Chambers called me regarding dental pain and requested an appointment with dentist. I have reviewed her chart and noted that she was at ER with the same problem and was referred to Dr Geralynn Ochs. Office of Dr Geralynn Ochs is closed today Friday. I will call again on Monday. I have advised Hailey Distel to continue using antibiotics as instructed and pain medication as needed.  Earlie Server Muhoro Rn BSn PCCN [phone]

## 2018-11-24 ENCOUNTER — Telehealth: Payer: Self-pay

## 2018-11-24 NOTE — Telephone Encounter (Signed)
I have called the office of Dr Geralynn Ochs and unfortunatley he does not take medicaid.  Honor Loh RN BSN PCCN [phone]

## 2018-11-30 ENCOUNTER — Telehealth: Payer: Self-pay

## 2018-11-30 NOTE — Telephone Encounter (Signed)
I have contacted [facility] and the practice accepts medicaid. I have provided contacts to [place] to call and make appointment with the help of interpreter. Earlie Server Shenelle Klas Rn BSN PCCN [phone]

## 2019-02-01 ENCOUNTER — Other Ambulatory Visit: Payer: Self-pay

## 2019-02-01 ENCOUNTER — Emergency Department (HOSPITAL_COMMUNITY)
Admission: EM | Admit: 2019-02-01 | Discharge: 2019-02-01 | Disposition: A | Discharge status: Home / Self Care | Payer: Medicaid Other | Attending: Emergency Medicine | Admitting: Emergency Medicine

## 2019-02-01 DIAGNOSIS — K0889 Other specified disorders of teeth and supporting structures: Secondary | ICD-10-CM

## 2019-02-01 MED ORDER — NAPROXEN 500 MG PO TABS: 500.0000 mg | ORAL_TABLET | Freq: Two times a day (BID) | ORAL | 0 refills | Status: DC

## 2019-02-01 MED ORDER — LIDOCAINE VISCOUS HCL 2 % MT SOLN: 15.0000 mL | OROMUCOSAL | 0 refills | Status: DC | PRN

## 2019-02-01 MED ORDER — AMOXICILLIN-POT CLAVULANATE 875-125 MG PO TABS: 1.0000 | ORAL_TABLET | Freq: Two times a day (BID) | ORAL | 0 refills | Status: DC

## 2019-02-01 NOTE — ED Triage Notes (Signed)
Pt here for evaluation of R sided dental pain x 1 week. Taking OTC meds with some relief. Pt's primary language Swahili.

## 2019-02-01 NOTE — Discharge Instructions (Addendum)
Take antibiotics as prescribed.  Take the entire course, even if your symptoms improve. Take naproxen twice a day with meals.  Do not take ibuprofen while taking this medicine.  You may supplement with Tylenol for further pain control. Use viscous lidocaine as needed for pain Follow up with a dentist. You may call a dentist on the list below.  Or you may call [facility] [phone]).  Return to the ER with any new, worsening, or concerning symptoms.

## 2019-02-01 NOTE — ED Provider Notes (Signed)
[facility] EMERGENCY DEPARTMENT Provider Note   CSN: [id number] Arrival date & time: 02/01/19  1358     History   Chief Complaint Chief Complaint  Patient presents with  . Dental Pain    HPI Hailey Chambers is a 43 y.o. female presenting for evaluation of dental pain.  Patient states of the past week, she has had pain in her right lower teeth.  Patient states she had something similar a month ago, was treated with antibiotics and the pain completely resolved until 1 week ago.  Patient states she just woke up and the pain was there 1 day, no trauma or injury.  She denies fevers, chills, difficulty opening her mouth, difficulty swallowing.  She has not taken anything for her pain including Tylenol ibuprofen.  She has no medical problems, takes no medications daily.  She has not followed with a dentist since her last dental problem.  Additional history obtained from chart review.  Per chart review, last dental infection was just over 2 months ago.  Patient was given information to [facility]  History provided with [facility] interpretive services.      HPI  Past Medical History:  Diagnosis Date  . Positive H. pylori test 06/03/2014    Patient Active Problem List   Diagnosis Date Noted  . Active labor at term 11/03/2015  . Anemia affecting pregnancy, antepartum 08/31/2015  . Language barrier 08/24/2015  . Fetal polydactyly affecting antepartum care of mother 08/24/2015  . Supervision of normal pregnancy 05/31/2015  . AMA (advanced maternal age) multigravida 35+ 05/31/2015  . Sickle cell trait (HCC) 04/25/2015  . Elevated BP 09/22/2014  . Abdominal pain, chronic, epigastric 06/02/2014    Past Surgical History:  Procedure Laterality Date  . NO PAST SURGERIES       OB History    Gravida  5   Para  5   Term  5   Preterm  0   AB  0   Living  4     SAB  0   TAB  0   Ectopic  0   Multiple  0   Live Births  4             Home Medications    Prior to Admission medications   Medication Sig Start Date End Date Taking? Authorizing Provider  amoxicillin (AMOXIL) 500 MG capsule Take 1 capsule (500 mg total) by mouth 3 (three) times daily. 11/17/18   Elson Areas, PA-C  amoxicillin (AMOXIL) 500 MG capsule Take 1 capsule (500 mg total) by mouth 3 (three) times daily. 11/17/18   Elson Areas, PA-C  amoxicillin-clavulanate (AUGMENTIN) 875-125 MG tablet Take 1 tablet by mouth every 12 (twelve) hours. 02/01/19   Kaedin Hicklin, PA-C  diclofenac (VOLTAREN) 75 MG EC tablet Take 1 tablet (75 mg total) by mouth 2 (two) times daily. 11/17/18   Elson Areas, PA-C  diclofenac (VOLTAREN) 75 MG EC tablet Take 1 tablet (75 mg total) by mouth 2 (two) times daily. 11/17/18   Elson Areas, PA-C  Elastic Bandages & Supports (COMFORT FIT MATERNITY SUPP MED) MISC 1 Device by Does not apply route daily. Patient not taking: Reported on 10/24/2015 09/19/15   [name], CNM  Elastic Bandages & Supports (T.E.D. KNEE LENGTH/S-REGULAR) MISC 1 Device by Does not apply route daily. Patient not taking: Reported on 10/24/2015 09/19/15   Sharen Counter A, CNM  ibuprofen (ADVIL,MOTRIN) 800 MG tablet Take  1 tablet (800 mg total) by mouth 3 (three) times daily. 06/30/16   Elson AreasSofia, Leslie K, PA-C  lidocaine (XYLOCAINE) 2 % solution Use as directed 15 mLs in the mouth or throat as needed for mouth pain. 02/01/19   Kamelia Lampkins, PA-C  naproxen (NAPROSYN) 500 MG tablet Take 1 tablet (500 mg total) by mouth 2 (two) times daily with a meal. 02/01/19   Lejon Afzal, PA-C    Family History Family History  Problem Relation Age of Onset  . Hypertension Mother   . Diabetes Neg Hx   . Cancer Neg Hx   . Heart disease Neg Hx     Social History Social History   Tobacco Use  . Smoking status: Never Smoker  . Smokeless tobacco: Never Used  Substance Use Topics  . Alcohol use: No    Alcohol/week: 0.0 standard  drinks  . Drug use: No     Allergies   Patient has no known allergies.   Review of Systems Review of Systems  Constitutional: Negative for fever.  HENT: Positive for dental problem.      Physical Exam Updated Vital Signs BP (!) 163/98 (BP Location: Right Arm)   Pulse (!) 58   Temp 98.1 F (36.7 C) (Oral)   Resp 15   SpO2 100%   Physical Exam Vitals signs and nursing note reviewed.  Constitutional:      General: She is not in acute distress.    Appearance: She is well-developed.     Comments: Sitting comfortably in the bed in no acute distress  HENT:     Head: Normocephalic and atraumatic.     Mouth/Throat:      Comments: No trismus.  No obvious facial swelling.  Overall poor dentition with multiple missing teeth.  Tenderness palpation of all 4 lower teeth on the right side.  Mild gum edema.  No pain under the tongue. Neck:     Musculoskeletal: Normal range of motion.  Cardiovascular:     Rate and Rhythm: Normal rate and regular rhythm.     Pulses: Normal pulses.  Pulmonary:     Effort: Pulmonary effort is normal.     Breath sounds: Normal breath sounds.  Abdominal:     General: There is no distension.  Musculoskeletal: Normal range of motion.  Skin:    General: Skin is warm.     Findings: No rash.  Neurological:     Mental Status: She is alert and oriented to person, place, and time.      ED Treatments / Results  Labs (all labs ordered are listed, but only abnormal results are displayed) Labs Reviewed - No data to display  EKG None  Radiology No results found.  Procedures Procedures (including critical care time)  Medications Ordered in ED Medications - No data to display   Initial Impression / Assessment and Plan / ED Course  I have reviewed the triage vital signs and the nursing notes.  Pertinent labs & imaging results that were available during my care of the patient were reviewed by me and considered in my medical decision making (see  chart for details).        Patient presenting for evaluation dental pain.  Physical exam shows patient appears nontoxic.  On exam, tenderness palpation of right lower teeth with mild gum edema.  As patient symptoms improved with antibiotics, and then worsened again, likely continued/new infection.  No sign of Ludwig's at this time.  Discussed that teeth will need to be  evaluated by dentist to prevent further infections.  However in the meantime will treat with antibiotics and pain control.  Discussed importance of taking antibiotics as prescribed.  Discussed follow-up with dentistry, patient given [facility], as well as resource guide for other dentists in the area.  At this time, patient appears safe for discharge.  Return precautions given.  Patient states she understands and agrees to plan.   Final Clinical Impressions(s) / ED Diagnoses   Final diagnoses:  Pain, dental    ED Discharge Orders         Ordered    amoxicillin-clavulanate (AUGMENTIN) 875-125 MG tablet  Every 12 hours     02/01/19 1524    naproxen (NAPROSYN) 500 MG tablet  2 times daily with meals     02/01/19 1524    lidocaine (XYLOCAINE) 2 % solution  As needed     02/01/19 [place], Demarri Elie, PA-C 02/01/19 1817    Tegeler, Gwenyth Allegra, MD 02/02/19 [phone]

## 2019-02-03 MED FILL — LIDOCAINE 2% VISCOUS SOLN: 2 | 6 days supply | Qty: 100 | Fill #0

## 2019-02-03 MED FILL — AMOX-CLAV 875-125 MG TABLET: 875-125 | 7 days supply | Qty: 14 | Fill #0

## 2019-02-03 MED FILL — NAPROXEN 500 MG TABLET: 500 | 14 days supply | Qty: 21 | Fill #0

## 2019-02-19 ENCOUNTER — Ambulatory Visit: Payer: Medicaid Other | Attending: Family Medicine | Admitting: Family Medicine

## 2019-02-19 ENCOUNTER — Other Ambulatory Visit: Payer: Self-pay

## 2019-02-19 ENCOUNTER — Encounter: Payer: Self-pay | Admitting: Family Medicine

## 2019-02-19 VITALS — BP 159/81 | HR 63 | Temp 97.9°F | Resp 18 | Ht <= 58 in | Wt 137.0 lb

## 2019-02-19 DIAGNOSIS — D509 Iron deficiency anemia, unspecified: Secondary | ICD-10-CM | POA: Diagnosis not present

## 2019-02-19 DIAGNOSIS — Z791 Long term (current) use of non-steroidal anti-inflammatories (NSAID): Secondary | ICD-10-CM | POA: Diagnosis not present

## 2019-02-19 DIAGNOSIS — Z758 Other problems related to medical facilities and other health care: Secondary | ICD-10-CM

## 2019-02-19 DIAGNOSIS — K0889 Other specified disorders of teeth and supporting structures: Secondary | ICD-10-CM

## 2019-02-19 DIAGNOSIS — D573 Sickle-cell trait: Secondary | ICD-10-CM | POA: Diagnosis not present

## 2019-02-19 DIAGNOSIS — Z23 Encounter for immunization: Secondary | ICD-10-CM

## 2019-02-19 DIAGNOSIS — Z789 Other specified health status: Secondary | ICD-10-CM

## 2019-02-19 DIAGNOSIS — R030 Elevated blood-pressure reading, without diagnosis of hypertension: Secondary | ICD-10-CM

## 2019-02-19 DIAGNOSIS — Z603 Acculturation difficulty: Secondary | ICD-10-CM

## 2019-02-19 MED ORDER — AMOXICILLIN 500 MG PO CAPS: ORAL_CAPSULE | ORAL | 0 refills | Status: DC

## 2019-02-19 MED ORDER — NAPROXEN 500 MG PO TABS: 500.0000 mg | ORAL_TABLET | Freq: Two times a day (BID) | ORAL | 0 refills | Status: DC

## 2019-02-19 MED FILL — AMOXICILLIN 500 MG CAPSULE: 500 | 10 days supply | Qty: 20 | Fill #0

## 2019-02-19 MED FILL — NAPROXEN 500 MG TABLET: 500 | 10 days supply | Qty: 21 | Fill #0

## 2019-02-20 LAB — CBC WITH DIFFERENTIAL/PLATELET
Basophils Absolute: 0 x10E3/uL (ref 0.0–0.2)
Basos: 1 %
EOS (ABSOLUTE): 0.1 x10E3/uL (ref 0.0–0.4)
Eos: 2 %
Hematocrit: 37.8 % (ref 34.0–46.6)
Hemoglobin: 12 g/dL (ref 11.1–15.9)
Immature Grans (Abs): 0 x10E3/uL (ref 0.0–0.1)
Immature Granulocytes: 0 %
Lymphocytes Absolute: 2.3 x10E3/uL (ref 0.7–3.1)
Lymphs: 40 %
MCH: 26.7 pg (ref 26.6–33.0)
MCHC: 31.7 g/dL (ref 31.5–35.7)
MCV: 84 fL (ref 79–97)
Monocytes Absolute: 0.6 x10E3/uL (ref 0.1–0.9)
Monocytes: 10 %
Neutrophils Absolute: 2.7 x10E3/uL (ref 1.4–7.0)
Neutrophils: 47 %
Platelets: 204 x10E3/uL (ref 150–450)
RBC: 4.49 x10E6/uL (ref 3.77–5.28)
RDW: 14.6 % (ref 11.7–15.4)
WBC: 5.7 x10E3/uL (ref 3.4–10.8)

## 2019-02-20 LAB — BASIC METABOLIC PANEL WITH GFR
BUN/Creatinine Ratio: 13 (ref 9–23)
BUN: 8 mg/dL (ref 6–24)
CO2: 22 mmol/L (ref 20–29)
Calcium: 9.1 mg/dL (ref 8.7–10.2)
Chloride: 103 mmol/L (ref 96–106)
Creatinine, Ser: 0.6 mg/dL (ref 0.57–1.00)
GFR calc Af Amer: 130 mL/min/1.73
GFR calc non Af Amer: 113 mL/min/1.73
Glucose: 76 mg/dL (ref 65–99)
Potassium: 4.4 mmol/L (ref 3.5–5.2)
Sodium: 135 mmol/L (ref 134–144)

## 2019-02-21 ENCOUNTER — Encounter: Payer: Self-pay | Admitting: Family Medicine

## 2019-02-21 NOTE — Progress Notes (Signed)
New Patient Office Visit  Subjective:  Patient ID: Hailey Chambers, female    DOB: Feb 09, 1976  Age: 43 y.o. MRN: [id number]  CC:  Chief Complaint  Patient presents with  . Establish Care    HPI Hailey Chambers, 43 year old female last seen in the office in 2016, who presents to establish care of ongoing medical issues.  She is status post emergency department visit on 02/01/2019 due to dental pain.  She was prescribed ibuprofen and amoxicillin.  Patient reports that her tooth pain improves with the use of antibiotics and pain medication but as soon as she finishes the antibiotic her tooth pain returns.  Patient reports that she has pain in her right lower next to last and last molar.  Pain is aching and sometimes throbbing and is a 7-8 on a 0-to-10 scale.  Patient would like to be referred to a dentist for further treatment or removal to help with tooth pain.  She would like to have additional antibiotic due to her continued tooth pain.  She would also like refill of pain medication given at the emergency department.  She denies any issues with upper abdominal discomfort of burning.  She denies any burping/belching or backwash of bad tasting fluid into her mouth or throat.  She has had no blood in the stool and no black stools.       She does report some fatigue.  She does have a history of anemia as well as a history of sickle cell trait.  She has had no unusual bruising or bleeding.  She denies any chest pain or palpitations, no shortness of breath or cough and no peripheral edema.  She reports that overall she feels well other than recurrent tooth pain.  She denies any prior issues with hypertension. . Past Medical History:  Diagnosis Date  . Positive H. pylori test 06/03/2014    Past Surgical History:  Procedure Laterality Date  . NO PAST SURGERIES      Family History  Problem Relation Age of Onset  . Hypertension Mother   . Diabetes Neg Hx   . Cancer Neg Hx   . Heart disease Neg  Hx     Social History   Socioeconomic History  . Marital status: Married    Spouse name: Not on file  . Number of children: 3  . Years of education: Not on file  . Highest education level: Not on file  Occupational History  . Not on file  Social Needs  . Financial resource strain: Not on file  . Food insecurity    Worry: Not on file    Inability: Not on file  . Transportation needs    Medical: Not on file    Non-medical: Not on file  Tobacco Use  . Smoking status: Never Smoker  . Smokeless tobacco: Never Used  Substance and Sexual Activity  . Alcohol use: No    Alcohol/week: 0.0 standard drinks  . Drug use: No  . Sexual activity: Yes    Birth control/protection: Other-see comments, Condom    Comment: calendar and condoms   Lifestyle  . Physical activity    Days per week: Not on file    Minutes per session: Not on file  . Stress: Not on file  Relationships  . Social Musician on phone: Not on file    Gets together: Not on file    Attends religious service: Not on file    Active member of club or  organization: Not on file    Attends meetings of clubs or organizations: Not on file    Relationship status: Not on file  . Intimate partner violence    Fear of current or ex partner: Not on file    Emotionally abused: Not on file    Physically abused: Not on file    Forced sexual activity: Not on file  Other Topics Concern  . Not on file  Social History Narrative   From [place]    Lives with husband and 3 children (had 4 children but one died young).   Moved to [place] in 02/2014    ROS Review of Systems  Objective:   Today's Vitals: BP (!) 159/81 (BP Location: Left Arm, Patient Position: Sitting, Cuff Size: Normal)   Pulse 63   Temp 97.9 F (36.6 C) (Oral)   Resp 18   Ht 4\' 8"  (1.422 m)   Wt 137 lb (62.1 kg)   LMP 02/18/2019   SpO2 100%   BMI 30.71 kg/m   Physical Exam Vitals signs and nursing note  reviewed.  Constitutional:      Appearance: Normal appearance.  HENT:     Mouth/Throat:     Comments: Patient has some edema and erythema of the gum on the buccal side along the next to last lower molar and patient has some mild tenderness with palpation of the cheek opposite this area but no facial swelling Neck:     Musculoskeletal: Normal range of motion and neck supple. No neck rigidity or muscular tenderness.     Vascular: No carotid bruit.  Cardiovascular:     Rate and Rhythm: Normal rate and regular rhythm.     Comments: Faint murmur on exam Pulmonary:     Effort: Pulmonary effort is normal.     Breath sounds: Normal breath sounds.  Musculoskeletal:        General: No swelling, tenderness or deformity.     Right lower leg: No edema.     Left lower leg: No edema.     Comments: No CVA tenderness  Lymphadenopathy:     Cervical: No cervical adenopathy.  Skin:    General: Skin is warm and dry.  Neurological:     General: No focal deficit present.     Mental Status: She is alert and oriented to person, place, and time.  Psychiatric:        Mood and Affect: Mood normal.        Behavior: Behavior normal.     Comments: Patient with what appeared to be a flattened affect but when she was asked if there was any current problem or if she was having any issues with depression, she reported no issues     Assessment & Plan:  1. Pain, dental Patient's emergency department note reviewed.  Patient has been referred to dentistry for further evaluation and treatment of her recurrent tooth pain.  Patient will have BMP in follow-up of her use of nonsteroidal anti-inflammatories for treatment of dental pain. - Ambulatory referral to Dentistry - Basic Metabolic Panel - naproxen (NAPROSYN) 500 MG tablet; Take 1 tablet (500 mg total) by mouth 2 (two) times daily with a meal. As needed for tooth pain  Dispense: 21 tablet; Refill: 0 - amoxicillin (AMOXIL) 500 MG capsule; One pill twice per day   Dispense: 20 capsule; Refill: 0  2. Microcytic anemia; 3.  Sickle cell trait Patient has history of anemia  as well as sickle cell trait.  Patient will have repeat CBC at today's visit and will be notified if any further therapy or evaluation  is needed based on the results .- CBC with differential  4. Language barrier Due to a language barrier, patient was accompanied by in person interpreter at today's visit  5. Elevated blood pressure reading Patient's blood pressure was elevated at today's visit.  Blood pressure was also elevated during her recent emergency department visit but patient thinks that this may have been due to tooth pain.  She would like to have blood pressure rechecked at a future date and not start medication at this time.  Low-sodium/Dash type diet recommended  6. Encounter for long-term (current) use of NSAIDs Patient will have BMP to check creatinine as well as CBC to look for anemia due to patient's long-term NSAID use - Basic Metabolic Panel -CBC with differential  7. Need for immunization against influenza Patient was offered and agreed to have influenza immunization at today's visit.  Educational handout regarding vaccination was given to the patient and explained/discussed with her by interpreter  Outpatient Encounter Medications as of 02/19/2019  Medication Sig  . ibuprofen (ADVIL,MOTRIN) 800 MG tablet Take 1 tablet (800 mg total) by mouth 3 (three) times daily.  Marland Kitchen lidocaine (XYLOCAINE) 2 % solution Use as directed 15 mLs in the mouth or throat as needed for mouth pain.  . naproxen (NAPROSYN) 500 MG tablet Take 1 tablet (500 mg total) by mouth 2 (two) times daily with a meal. As needed for tooth pain  . [DISCONTINUED] naproxen (NAPROSYN) 500 MG tablet Take 1 tablet (500 mg total) by mouth 2 (two) times daily with a meal.  . amoxicillin (AMOXIL) 500 MG capsule One pill twice per day  . [DISCONTINUED] amoxicillin (AMOXIL) 500 MG capsule Take 1 capsule (500 mg  total) by mouth 3 (three) times daily.  . [DISCONTINUED] amoxicillin (AMOXIL) 500 MG capsule Take 1 capsule (500 mg total) by mouth 3 (three) times daily.  . [DISCONTINUED] amoxicillin-clavulanate (AUGMENTIN) 875-125 MG tablet Take 1 tablet by mouth every 12 (twelve) hours.  . [DISCONTINUED] diclofenac (VOLTAREN) 75 MG EC tablet Take 1 tablet (75 mg total) by mouth 2 (two) times daily.  . [DISCONTINUED] diclofenac (VOLTAREN) 75 MG EC tablet Take 1 tablet (75 mg total) by mouth 2 (two) times daily.  . [DISCONTINUED] Elastic Bandages & Supports (COMFORT FIT MATERNITY SUPP MED) MISC 1 Device by Does not apply route daily. (Patient not taking: Reported on 10/24/2015)  . [DISCONTINUED] Elastic Bandages & Supports (T.E.D. KNEE LENGTH/S-REGULAR) MISC 1 Device by Does not apply route daily. (Patient not taking: Reported on 10/24/2015)   No facility-administered encounter medications on file as of 02/19/2019.     Follow-up: Return in about 4 weeks (around 03/19/2019) for f/u anemia and elevated blood pressure.   Antony Blackbird, MD

## 2019-03-09 ENCOUNTER — Encounter: Payer: Self-pay | Admitting: *Deleted

## 2019-03-19 ENCOUNTER — Ambulatory Visit: Payer: Medicaid Other | Admitting: Family Medicine

## 2019-04-16 ENCOUNTER — Other Ambulatory Visit: Payer: Self-pay

## 2019-04-16 ENCOUNTER — Encounter: Payer: Self-pay | Admitting: Family Medicine

## 2019-04-16 ENCOUNTER — Ambulatory Visit: Payer: Medicaid Other | Attending: Family Medicine | Admitting: Family Medicine

## 2019-04-16 VITALS — BP 152/81 | HR 59 | Temp 98.3°F | Ht 59.0 in | Wt 141.0 lb

## 2019-04-16 DIAGNOSIS — Z131 Encounter for screening for diabetes mellitus: Secondary | ICD-10-CM

## 2019-04-16 DIAGNOSIS — I10 Essential (primary) hypertension: Secondary | ICD-10-CM | POA: Diagnosis not present

## 2019-04-16 DIAGNOSIS — Z87898 Personal history of other specified conditions: Secondary | ICD-10-CM

## 2019-04-16 DIAGNOSIS — Z8249 Family history of ischemic heart disease and other diseases of the circulatory system: Secondary | ICD-10-CM | POA: Insufficient documentation

## 2019-04-16 NOTE — Progress Notes (Signed)
Established Patient Office Visit  Subjective:  Patient ID: Hailey Chambers, female    DOB: 12/05/1975  Age: 43 y.o. MRN: [id number]  CC:  Chief Complaint  Patient presents with  . Hypertension   Stratus video interpretation system used at today's visit due to language barrier  HPI Hailey Chambers, 43 yo female, who is seen in follow-up of visit to establish care on 02/19/2019, at which time she had elevated blood pressure at both ED and office but did not wish to start medication for the treatment of HTN and presents for BP follow-up. She also has a history of anemia but recent CBC was normal. BMP at last visit was normal.  Per chart, patient also with history of prediabetes/elevated blood sugars.       She reports no issues at today's visit.  She reports that she believes that her blood pressure is mostly elevated when she comes to the doctor's office.  She does not believe that she has hypertension and does not want to be on medication at this time.  She wishes to try changes in her diet for another few months and then have her blood pressure rechecked and she is sure that her blood pressure will be normal on recheck.  She denies headaches or dizziness related to her blood pressure.  She does not recall any past issues with gestational diabetes or being told that her blood sugar was elevated in the past.  She denies any increased thirst, no urinary frequency and no issues with blurred vision.  She reports that overall she feels that she is in good health.   Past Medical History:  Diagnosis Date  . Positive H. pylori test 06/03/2014    Past Surgical History:  Procedure Laterality Date  . NO PAST SURGERIES      Family History  Problem Relation Age of Onset  . Hypertension Mother   . Diabetes Neg Hx   . Cancer Neg Hx   . Heart disease Neg Hx     Social History   Socioeconomic History  . Marital status: Married    Spouse name: Not on file  . Number of children: 3  . Years of  education: Not on file  . Highest education level: Not on file  Occupational History  . Not on file  Tobacco Use  . Smoking status: Never Smoker  . Smokeless tobacco: Never Used  Substance and Sexual Activity  . Alcohol use: No    Alcohol/week: 0.0 standard drinks  . Drug use: No  . Sexual activity: Yes    Birth control/protection: Other-see comments, Condom    Comment: calendar and condoms   Other Topics Concern  . Not on file  Social History Narrative   From [place].   Refugee  to [place] then immigrated to [place].    Lives with husband and 3 children (had 4 children but one died young).   Moved to [place] in 10-Mar-2014   Social Determinants of Health   Financial Resource Strain:   . Difficulty of Paying Living Expenses: Not on file  Food Insecurity:   . Worried About Programme researcher, broadcasting/film/video in the Last Year: Not on file  . Ran Out of Food in the Last Year: Not on file  Transportation Needs:   . Lack of Transportation (Medical): Not on file  . Lack of Transportation (Non-Medical): Not on file  Physical Activity:   . Days of Exercise per Week: Not on file  . Minutes of Exercise per  Session: Not on file  Stress:   . Feeling of Stress : Not on file  Social Connections:   . Frequency of Communication with Friends and Family: Not on file  . Frequency of Social Gatherings with Friends and Family: Not on file  . Attends Religious Services: Not on file  . Active Member of Clubs or Organizations: Not on file  . Attends BankerClub or Organization Meetings: Not on file  . Marital Status: Not on file  Intimate Partner Violence:   . Fear of Current or Ex-Partner: Not on file  . Emotionally Abused: Not on file  . Physically Abused: Not on file  . Sexually Abused: Not on file    Outpatient Medications Prior to Visit  Medication Sig Dispense Refill  . amoxicillin (AMOXIL) 500 MG capsule One pill twice per day (Patient not taking: Reported on 04/16/2019) 20 capsule 0  . ibuprofen (ADVIL,MOTRIN) 800 MG  tablet Take 1 tablet (800 mg total) by mouth 3 (three) times daily. (Patient not taking: Reported on 04/16/2019) 21 tablet 0  . lidocaine (XYLOCAINE) 2 % solution Use as directed 15 mLs in the mouth or throat as needed for mouth pain. (Patient not taking: Reported on 04/16/2019) 100 mL 0  . naproxen (NAPROSYN) 500 MG tablet Take 1 tablet (500 mg total) by mouth 2 (two) times daily with a meal. As needed for tooth pain (Patient not taking: Reported on 04/16/2019) 21 tablet 0   No facility-administered medications prior to visit.    No Known Allergies  ROS Review of Systems  Constitutional: Negative for chills, fatigue and fever.  HENT: Negative for sore throat and trouble swallowing.   Respiratory: Negative for cough and shortness of breath.   Cardiovascular: Negative for chest pain and palpitations.  Gastrointestinal: Negative for abdominal pain, constipation, diarrhea and nausea.  Endocrine: Negative for polydipsia, polyphagia and polyuria.  Genitourinary: Negative for dysuria and frequency.  Musculoskeletal: Negative for arthralgias and back pain.  Neurological: Negative for dizziness and headaches.  Hematological: Negative for adenopathy. Does not bruise/bleed easily.      Objective:    Physical Exam  Constitutional: She appears well-developed and well-nourished.  Neck: No JVD present. No thyromegaly present.  Cardiovascular: Normal rate and regular rhythm.  Pulmonary/Chest: Effort normal and breath sounds normal. No respiratory distress. She has no wheezes.  Abdominal: There is no abdominal tenderness. There is no rebound and no guarding.  Musculoskeletal:        General: No tenderness or edema.     Comments: No CVA tenderness  Lymphadenopathy:    She has no cervical adenopathy.  Neurological: She is alert.  Nursing note and vitals reviewed.   BP (!) 152/81   Pulse (!) 59   Temp 98.3 F (36.8 C) (Oral)   Ht 4\' 11"  (1.499 m)   Wt 141 lb (64 kg)   SpO2 100%   BMI  28.48 kg/m  Wt Readings from Last 3 Encounters:  04/16/19 141 lb (64 kg)  02/19/19 137 lb (62.1 kg)  06/30/16 130 lb 9.6 oz (59.2 kg)     Health Maintenance Due  Topic Date Due  . PNEUMOCOCCAL POLYSACCHARIDE VACCINE AGE 62-64 HIGH RISK  03/01/1978  . FOOT EXAM  03/01/1986  . OPHTHALMOLOGY EXAM  03/01/1986  . URINE MICROALBUMIN  03/01/1986  . HEMOGLOBIN A1C  07/01/2015  . PAP SMEAR-Modifier  12/28/2017   Patient will have hemoglobin A1c at today's visit as she does not recall past history of prediabetes or ever being told  that her blood sugar was elevated in the past.  On review of chart she did have prior slightly elevated 1 hour glucose tolerance test during pregnancy in 2017.  If hemoglobin A1c is normal she does not require foot exam, ophthalmology exam, foot exam and may not need urine microalbumin.  She declines pneumococcal immunization.  Last hemoglobin A1c in the chart was 5.6 in 2018 which was normal.  Lab Results  Component Value Date   TSH 1.096 12/29/2014   Lab Results  Component Value Date   WBC 5.7 02/19/2019   HGB 12.0 02/19/2019   HCT 37.8 02/19/2019   MCV 84 02/19/2019   PLT 204 02/19/2019   Lab Results  Component Value Date   NA 135 02/19/2019   K 4.4 02/19/2019   CO2 22 02/19/2019   GLUCOSE 76 02/19/2019   BUN 8 02/19/2019   CREATININE 0.60 02/19/2019   BILITOT 0.3 06/02/2014   ALKPHOS 52 06/02/2014   AST 29 06/02/2014   ALT 30 06/02/2014   PROT 7.7 06/02/2014   ALBUMIN 3.8 06/02/2014   CALCIUM 9.1 02/19/2019   Lab Results  Component Value Date   CHOL 149 06/02/2014   Lab Results  Component Value Date   HDL 61 06/02/2014   Lab Results  Component Value Date   LDLCALC 58 06/02/2014   Lab Results  Component Value Date   TRIG 150 (H) 06/02/2014   Lab Results  Component Value Date   CHOLHDL 2.4 06/02/2014   Lab Results  Component Value Date   HGBA1C 5.60 12/29/2014      Assessment & Plan:  1. Elevated blood pressure reading with  diagnosis of hypertension She has had elevated blood pressure readings in the emergency department and x2 here in the office which is consistent with hypertension.  This was discussed with the patient at today's visit through an interpreter however patient does not believe that she has hypertension.  Patient would like to continue changes in her diet along with exercise and follow-up in a few months as she believes that her blood pressure will be normal at this time.  2. History of prediabetes Chart/health maintenance indicates that patient may have previously been diagnosed with diabetes however I could not find supporting labs other than prior abnormal 1 hour glucose tolerance test.  Her most recent hemoglobin A1c was 5.6 in 2018 which is normal.  She will have hemoglobin A1c at today's visit and if this is normal, she will not need diabetic health maintenance however she will be notified if any further dietary changes or interventions are needed if result is abnormal. - Hemoglobin A1c   An After Visit Summary was printed and given to the patient.  Follow-up: Return in about 3 months (around 07/15/2019) for Blood pressure recheck.   Antony Blackbird, MD

## 2019-04-17 LAB — HEMOGLOBIN A1C
Est. average glucose Bld gHb Est-mCnc: 120 mg/dL
Hgb A1c MFr Bld: 5.8 % — ABNORMAL HIGH (ref 4.8–5.6)

## 2019-04-22 ENCOUNTER — Encounter: Payer: Self-pay | Admitting: *Deleted

## 2019-07-15 ENCOUNTER — Ambulatory Visit: Payer: Medicaid Other | Attending: Family Medicine | Admitting: Family Medicine

## 2019-07-15 ENCOUNTER — Encounter: Payer: Self-pay | Admitting: Family Medicine

## 2019-07-15 ENCOUNTER — Other Ambulatory Visit: Payer: Self-pay

## 2019-07-15 VITALS — BP 155/85 | HR 55 | Temp 97.2°F | Resp 16 | Wt 141.8 lb

## 2019-07-15 DIAGNOSIS — Z758 Other problems related to medical facilities and other health care: Secondary | ICD-10-CM

## 2019-07-15 DIAGNOSIS — I10 Essential (primary) hypertension: Secondary | ICD-10-CM

## 2019-07-15 DIAGNOSIS — Z8249 Family history of ischemic heart disease and other diseases of the circulatory system: Secondary | ICD-10-CM | POA: Insufficient documentation

## 2019-07-15 DIAGNOSIS — Z789 Other specified health status: Secondary | ICD-10-CM | POA: Diagnosis not present

## 2019-07-15 DIAGNOSIS — H109 Unspecified conjunctivitis: Secondary | ICD-10-CM | POA: Diagnosis not present

## 2019-07-15 DIAGNOSIS — Z603 Acculturation difficulty: Secondary | ICD-10-CM

## 2019-07-15 DIAGNOSIS — R7303 Prediabetes: Secondary | ICD-10-CM

## 2019-07-15 MED ORDER — OLOPATADINE HCL 0.2 % OP SOLN: OPHTHALMIC | 2 refills | Status: DC

## 2019-07-15 NOTE — Progress Notes (Signed)
Subjective:  Patient ID: Hailey Chambers, female    DOB: 03-07-1976  Age: 44 y.o. MRN: [id number]  CC: Follow-up  [employer] video interpretation services used at today's visit due to language barrier  HPI [name], 44 year old female with prediabetes with hemoglobin A1c of 5.8 on 04/16/2019 and history of elevated blood pressure with blood pressure reading of 159/81 on 02/19/2019 and blood pressure of 152/81 on 04/16/2019 who presents today due to complaint of a few days of sensation of irritation in the left eye.  She does not recall actually getting anything into her eye.  She has felt as if there is some itchiness to both eyes but mostly in the left.  She denies any clear watery eyes or any discolored eye drainage and eyes did not feel sticky or matted in the mornings.  She did have some redness of her eyes about 2 days ago but this is now cleared up.  She denies any changes in her vision, no loss of vision, no double vision or blurred vision.  She denies any nasal congestion, no postnasal drainage, no clear nasal discharge or sneezing.  She denies any sore throat, no headache or dizziness and no fever or chills.  She does need to obtain a note for work today.           She denies any issues with increased thirst or urinary frequency related to prediabetes.  She still does not wish to take medication for treatment of high blood pressure as she believes that her blood pressures at home are not as high as blood pressures that are taken here in the office.  She denies chest pain or palpitations, no shortness of breath or cough and no headaches or dizziness.  Past Medical History:  Diagnosis Date  . Positive H. pylori test 06/03/2014    Past Surgical History:  Procedure Laterality Date  . NO PAST SURGERIES      Family History  Problem Relation Age of Onset  . Hypertension Mother   . Diabetes Neg Hx   . Cancer Neg Hx   . Heart disease Neg Hx     Social History   Tobacco Use  .  Smoking status: Never Smoker  . Smokeless tobacco: Never Used  Substance Use Topics  . Alcohol use: No    Alcohol/week: 0.0 standard drinks    ROS Review of Systems  Constitutional: Positive for fatigue. Negative for chills and fever.  HENT: Negative for postnasal drip, rhinorrhea, sneezing and sore throat.   Eyes: Positive for redness and itching. Negative for photophobia, pain, discharge and visual disturbance.  Respiratory: Negative for cough and shortness of breath.   Cardiovascular: Negative for chest pain and palpitations.  Gastrointestinal: Negative for abdominal pain, constipation, diarrhea and nausea.  Endocrine: Negative for polydipsia, polyphagia and polyuria.  Genitourinary: Negative for dysuria and frequency.  Musculoskeletal: Negative for arthralgias and back pain.  Neurological: Negative for dizziness and headaches.  Hematological: Negative for adenopathy. Does not bruise/bleed easily.    Objective:   Today's Vitals: BP (!) 155/85 (BP Location: Left Arm, Patient Position: Sitting, Cuff Size: Normal)   Pulse (!) 55   Temp (!) 97.2 F (36.2 C)   Resp 16   Wt 141 lb 12.8 oz (64.3 kg)   LMP 07/14/2019   SpO2 98%   Breastfeeding No   BMI 28.64 kg/m   Physical Exam Vitals and nursing note reviewed.  Constitutional:      Appearance: Normal appearance.  Comments: Well-nourished well-developed older female in no acute distress.  Patient wearing mask as per office COVID-19 protocol  Eyes:     Extraocular Movements: Extraocular movements intact.     Conjunctiva/sclera: Conjunctivae normal.  Cardiovascular:     Rate and Rhythm: Normal rate and regular rhythm.  Pulmonary:     Effort: Pulmonary effort is normal.     Breath sounds: Normal breath sounds.  Abdominal:     Palpations: Abdomen is soft.     Tenderness: There is no abdominal tenderness. There is no guarding or rebound.  Musculoskeletal:     Cervical back: Normal range of motion and neck supple.    Lymphadenopathy:     Cervical: No cervical adenopathy.  Skin:    General: Skin is warm and dry.  Neurological:     General: No focal deficit present.     Mental Status: She is alert and oriented to person, place, and time.  Psychiatric:        Mood and Affect: Mood normal.        Behavior: Behavior normal.     Assessment & Plan:  1. Conjunctivitis of left eye, unspecified conjunctivitis type Based on patient's description of recent symptoms, she likely has symptoms related to allergic conjunctivitis.  Prescription provided for generic Pataday eyedrops to use as needed for eye itching/redness.  If she has any actual eye pain, loss of vision or any other concerns, she should return for further evaluation or go to urgent care during nonclinical hours - Olopatadine HCl 0.2 % SOLN; One drop to the affected eye once per day as needed for itching  Dispense: 2.5 mL; Refill: 2  2. Prediabetes Patient was scheduled to return for basic metabolic panel and hemoglobin A1c in a few weeks when she comes for recheck of her blood pressure/meeting with clinical pharmacist regarding hypertension however it appears that her lab work was done before she left today's visit.  She will be notified of the results and low carbohydrate diet encouraged. - Basic Metabolic Panel; Future - Hemoglobin A1C; Future - Hemoglobin Q0H - Basic Metabolic Panel  3. Elevated blood pressure reading with diagnosis of hypertension Discussed with patient that technically she has hypertension as this is now the third time here in the office that her blood pressure has been elevated.  She does not wish to be on medication for treatment of hypertension but wishes to continue to try dietary changes and weight loss.  Patient will have appointment to return for follow-up with clinical pharmacist for blood pressure recheck and further discussion of blood pressure medication for control of hypertension  4. Language barrier Stratus video  interpretation system used at today's visit to help with language barrier  Outpatient Encounter Medications as of 07/15/2019  Medication Sig  . [DISCONTINUED] amoxicillin (AMOXIL) 500 MG capsule One pill twice per day (Patient not taking: Reported on 04/16/2019)  . [DISCONTINUED] ibuprofen (ADVIL,MOTRIN) 800 MG tablet Take 1 tablet (800 mg total) by mouth 3 (three) times daily. (Patient not taking: Reported on 04/16/2019)  . [DISCONTINUED] lidocaine (XYLOCAINE) 2 % solution Use as directed 15 mLs in the mouth or throat as needed for mouth pain. (Patient not taking: Reported on 04/16/2019)  . [DISCONTINUED] naproxen (NAPROSYN) 500 MG tablet Take 1 tablet (500 mg total) by mouth 2 (two) times daily with a meal. As needed for tooth pain (Patient not taking: Reported on 04/16/2019)   No facility-administered encounter medications on file as of 07/15/2019.    An After Visit  Summary was printed and given to the patient.   Follow-up: Return in about 6 weeks (around 08/26/2019) for chronic issues; 2-3 weeks with Luke/CPP-BP check and lab; Marland Kitchen   Cain Saupe MD

## 2019-07-15 NOTE — Progress Notes (Signed)
Eye pain x 3 days. Scratchy eyes Does not have film over eyes when she wakes up.    3 month f/u  CBG-  A1C-

## 2019-07-16 LAB — BASIC METABOLIC PANEL WITH GFR
BUN/Creatinine Ratio: 14 (ref 9–23)
BUN: 9 mg/dL (ref 6–24)
CO2: 21 mmol/L (ref 20–29)
Calcium: 9.3 mg/dL (ref 8.7–10.2)
Chloride: 107 mmol/L — ABNORMAL HIGH (ref 96–106)
Creatinine, Ser: 0.64 mg/dL (ref 0.57–1.00)
GFR calc Af Amer: 126 mL/min/1.73
GFR calc non Af Amer: 110 mL/min/1.73
Glucose: 79 mg/dL (ref 65–99)
Potassium: 5.2 mmol/L (ref 3.5–5.2)
Sodium: 139 mmol/L (ref 134–144)

## 2019-07-16 LAB — HEMOGLOBIN A1C
Est. average glucose Bld gHb Est-mCnc: 117 mg/dL
Hgb A1c MFr Bld: 5.7 % — ABNORMAL HIGH (ref 4.8–5.6)

## 2019-07-19 ENCOUNTER — Encounter: Payer: Self-pay | Admitting: Family Medicine

## 2019-07-20 ENCOUNTER — Encounter: Payer: Self-pay | Admitting: *Deleted

## 2019-07-29 ENCOUNTER — Ambulatory Visit: Payer: Medicaid Other | Admitting: Pharmacist

## 2019-08-24 ENCOUNTER — Telehealth: Payer: Self-pay

## 2019-08-24 NOTE — Telephone Encounter (Signed)
Ms Dazja called requesting assistance with medicaid forms she received in the mail. She will bring the medicaid forms to me for assistance on a later date. Arman Bogus RN BSN PCCn [phone]

## 2019-08-26 ENCOUNTER — Ambulatory Visit: Payer: Medicaid Other | Admitting: Family Medicine

## 2019-11-23 ENCOUNTER — Ambulatory Visit: Payer: Medicaid Other | Attending: Family | Admitting: Family

## 2019-11-23 ENCOUNTER — Encounter: Payer: Self-pay | Admitting: Family

## 2019-11-23 ENCOUNTER — Other Ambulatory Visit: Payer: Self-pay

## 2019-11-23 VITALS — BP 132/83 | HR 61 | Temp 98.1°F | Resp 16 | Ht 59.0 in | Wt 137.0 lb

## 2019-11-23 DIAGNOSIS — I10 Essential (primary) hypertension: Secondary | ICD-10-CM

## 2019-11-23 DIAGNOSIS — R7303 Prediabetes: Secondary | ICD-10-CM | POA: Diagnosis not present

## 2019-11-23 DIAGNOSIS — Z789 Other specified health status: Secondary | ICD-10-CM | POA: Diagnosis not present

## 2019-11-23 MED ORDER — AMLODIPINE BESYLATE 5 MG PO TABS: 5.0000 mg | ORAL_TABLET | Freq: Every day | ORAL | 0 refills | Status: DC

## 2019-11-23 NOTE — Progress Notes (Signed)
Patient ID: Hailey Chambers, female    DOB: [birth year]  MRN: [id number]  CC: Chronic Conditions Follow-Up    Subjective: Hailey Chambers is a 44 y.o. female with history of elevated blood pressure and sickle cell trait who presents for chronic conditions follow-up.  1. ELEVATED BLOOD PRESSURE FOLLOW-UP: Adherence with salt restriction: []  Yes    [x]  No Exercise: Yes []  No [x]  Home Monitoring?: []  Yes    [x]  No Smoking []  Yes [x]  No SOB? []  Yes    [x]  No Chest Pain?: []  Yes    [x]  No Leg swelling?: []  Yes    [x]  No Headaches?: []  Yes    [x]  No Dizziness? []  Yes    [x]  No  Comments: Last visit 07/15/2019 with Dr. . During that encounter patient with elevated blood pressure for the third time in office and told that she technically has hypertension. Patient did not wish to be on treatment at that time and wanted to try dietary changes and weight loss. Patient was to return for follow-up blood pressure check with clinical pharmacist.  2. PRE-DIABETES FOLLOW-UP: Last A1C:  5.7% on 07/15/2019 Are you fasting today: []  Yes [x]  No fu-fu and water  Diet Adherence: []  Yes    [x]  No Exercise: []  Yes    [x]  No    Patient Active Problem List   Diagnosis Date Noted  . Active labor at term 11/03/2015  . Anemia affecting pregnancy, antepartum 08/31/2015  . Language barrier 08/24/2015  . Fetal polydactyly affecting antepartum care of mother 08/24/2015  . Supervision of normal pregnancy 05/31/2015  . AMA (advanced maternal age) multigravida 35+ 05/31/2015  . Sickle cell trait (HCC) 04/25/2015  . Elevated BP 09/22/2014  . Abdominal pain, chronic, epigastric 06/02/2014     Current Outpatient Medications on File Prior to Visit  Medication Sig Dispense Refill  . Olopatadine HCl 0.2 % SOLN One drop to the affected eye once per day as needed for itching 2.5 mL 2   No current facility-administered medications on file prior to visit.    No Known Allergies  Social History    Socioeconomic History  . Marital status: Married    Spouse name: Not on file  . Number of children: 3  . Years of education: Not on file  . Highest education level: Not on file  Occupational History  . Not on file  Tobacco Use  . Smoking status: Never Smoker  . Smokeless tobacco: Never Used  Substance and Sexual Activity  . Alcohol use: No    Alcohol/week: 0.0 standard drinks  . Drug use: No  . Sexual activity: Yes    Birth control/protection: Other-see comments, Condom    Comment: calendar and condoms   Other Topics Concern  . Not on file  Social History Narrative   From [place].   Refugee  to then immigrated to .    Lives with husband and 3 children (had 4 children but one died young).   Moved to in 13-Mar-2014   Social Determinants of Health   Financial Resource Strain:   . Difficulty of Paying Living Expenses:   Food Insecurity:   . Worried About in the Last Year:   . 09/14/2019 in the Last Year:   Transportation Needs:   . Jillyn Hidden (Medical):   09/14/2019 Lack of Transportation (Non-Medical):   Physical Activity:   . Days of Exercise per Week:   . Minutes of Exercise  per Session:   Stress:   . Feeling of Stress :   Social Connections:   . Frequency of Communication with Friends and Family:   . Frequency of Social Gatherings with Friends and Family:   . Attends Religious Services:   . Active Member of Clubs or Organizations:   . Attends Banker Meetings:   Marland Kitchen Marital Status:   Intimate Partner Violence:   . Fear of Current or Ex-Partner:   . Emotionally Abused:   Marland Kitchen Physically Abused:   . Sexually Abused:     Family History  Problem Relation Age of Onset  . Hypertension Mother   . Diabetes Neg Hx   . Cancer Neg Hx   . Heart disease Neg Hx     Past Surgical History:  Procedure Laterality Date  . NO PAST SURGERIES      ROS: Review of Systems Negative except as stated above  PHYSICAL  EXAM: Vitals with BMI 11/23/2019 07/15/2019 04/16/2019  Height 4\' 11"  - 4\' 11"   Weight 137 lbs 141 lbs 13 oz 141 lbs  BMI 27.66 - 28.46  Systolic 132 155  Diastolic 83 85 81  Pulse 61 55 59  SpO2- 100%, room air  Temperature- 98.60F, oral  Wt Readings from Last 3 Encounters:  11/23/19 137 lb (62.1 kg)  07/15/19 141 lb 12.8 oz (64.3 kg)  04/16/19 141 lb (64 kg)   Physical Exam  General appearance - alert, well appearing, and in no distress Neck - supple, no significant adenopathy Lymphatics - no palpable lymphadenopathy, no hepatosplenomegaly Chest - clear to auscultation, no wheezes, rales or rhonchi, symmetric air entry, no tachypnea, retractions or cyanosis Heart - normal rate, regular rhythm, normal S1, S2, no murmurs, rubs, clicks or gallops Neurological - alert, oriented, normal speech, no focal findings or movement disorder noted Musculoskeletal - no joint tenderness, deformity or swelling, no muscular tenderness noted, full range of motion without pain Extremities - peripheral pulses normal, no pedal edema, no clubbing or cyanosis   ASSESSMENT AND PLAN: 1. Essential hypertension: -Patient today with elevated blood pressure reading, asymptomatic, without chest pressure, chest pain, palpitations, and shortness of breath.  -Patient with elevated blood pressure readings at 3 previous office visits with primary physician. At that time it was explained to patient that she had hypertension. However, patient did not want to try medication treatment during those times and opted to try dietary changes and weight loss.  -Patient with stage 1 hypertension. Patient agreeable to medication management at this time.  -Adding low-dose Amlodipine for blood pressure management. Counseled patient should she experience side effects such as but not limited dizziness and or ankle swelling she should notify provider immediately.  -Counseled on blood pressure goal of less than 130/80, low-sodium,  DASH diet, medication compliance, 150 minutes of moderate intensity exercise per week as tolerated. Discussed medication compliance, adverse effects. -Last BMP obtained 07/15/2019. -Last CBC obtained 02/19/2019. -Follow-up with clinical pharmacist for blood pressure check in 1 month. Medication may be adjusted at that time if needed. -Follow-up with primary physician in 4 months or sooner if needed.  - amLODipine (NORVASC) 5 MG tablet; Take 1 tablet (5 mg total) by mouth daily.  Dispense: 30 tablet; Refill: 0  2. Prediabetes: -Last hemoglobin A1C 5.7% on 07/15/2019. Plan to recheck in about 2 months.  -Discussed the importance of healthy eating habits, low-carbohydrate diet, low-sugar diet, regular aerobic exercise (at least 150 minutes a week as tolerated) to achieve or maintain control of  prediabetes. -Follow-up with primary physician as needed.   3. Language barrier: -[employer] participated during this visit. Interpreter Name: Sharlet Salina, ID#: [id number].   Patient was given the opportunity to ask questions.  Patient verbalized understanding of the plan and was able to repeat key elements of the plan. Patient was given clear instructions to go to Emergency Department or return to medical center if symptoms don't improve, worsen, or new problems develop.The patient verbalized understanding.   Rema Fendt, NP

## 2019-11-23 NOTE — Patient Instructions (Signed)
Amlodipine for high blood pressure. Follow-up with clinical pharmacist in 4 weeks for blood pressure check. Follow-up with primary physician in 4 months or sooner if needed. Hypertension, Adult Hypertension is another name for high blood pressure. High blood pressure forces your heart to work harder to pump blood. This can cause problems over time. There are two numbers in a blood pressure reading. There is a top number (systolic) over a bottom number (diastolic). It is best to have a blood pressure that is below 120/80. Healthy choices can help lower your blood pressure, or you may need medicine to help lower it. What are the causes? The cause of this condition is not known. Some conditions may be related to high blood pressure. What increases the risk?  Smoking.  Having type 2 diabetes mellitus, high cholesterol, or both.  Not getting enough exercise or physical activity.  Being overweight.  Having too much fat, sugar, calories, or salt (sodium) in your diet.  Drinking too much alcohol.  Having long-term (chronic) kidney disease.  Having a family history of high blood pressure.  Age. Risk increases with age.  Race. You may be at higher risk if you are African American.  Gender. Men are at higher risk than women before age 52. After age 26, women are at higher risk than men.  Having obstructive sleep apnea.  Stress. What are the signs or symptoms?  High blood pressure may not cause symptoms. Very high blood pressure (hypertensive crisis) may cause: ? Headache. ? Feelings of worry or nervousness (anxiety). ? Shortness of breath. ? Nosebleed. ? A feeling of being sick to your stomach (nausea). ? Throwing up (vomiting). ? Changes in how you see. ? Very bad chest pain. ? Seizures. How is this treated?  This condition is treated by making healthy lifestyle changes, such as: ? Eating healthy foods. ? Exercising more. ? Drinking less alcohol.  Your health care provider  may prescribe medicine if lifestyle changes are not enough to get your blood pressure under control, and if: ? Your top number is above 130. ? Your bottom number is above 80.  Your personal target blood pressure may vary. Follow these instructions at home: Eating and drinking   If told, follow the DASH eating plan. To follow this plan: ? Fill one half of your plate at each meal with fruits and vegetables. ? Fill one fourth of your plate at each meal with whole grains. Whole grains include whole-wheat pasta, brown rice, and whole-grain bread. ? Eat or drink low-fat dairy products, such as skim milk or low-fat yogurt. ? Fill one fourth of your plate at each meal with low-fat (lean) proteins. Low-fat proteins include fish, chicken without skin, eggs, beans, and tofu. ? Avoid fatty meat, cured and processed meat, or chicken with skin. ? Avoid pre-made or processed food.  Eat less than 1,500 mg of salt each day.  Do not drink alcohol if: ? Your doctor tells you not to drink. ? You are pregnant, may be pregnant, or are planning to become pregnant.  If you drink alcohol: ? Limit how much you use to:  0-1 drink a day for women.  0-2 drinks a day for men. ? Be aware of how much alcohol is in your drink. In the [place] one drink equals one 12 oz bottle of beer (355 mL), one 5 oz glass of wine (148 mL), or one 1 oz glass of hard liquor (44 mL). Lifestyle   Work with your doctor to stay  at a healthy weight or to lose weight. Ask your doctor what the best weight is for you.  Get at least 30 minutes of exercise most days of the week. This may include walking, swimming, or biking.  Get at least 30 minutes of exercise that strengthens your muscles (resistance exercise) at least 3 days a week. This may include lifting weights or doing Pilates.  Do not use any products that contain nicotine or tobacco, such as cigarettes, e-cigarettes, and chewing tobacco. If you need help quitting, ask your  doctor.  Check your blood pressure at home as told by your doctor.  Keep all follow-up visits as told by your doctor. This is important. Medicines  Take over-the-counter and prescription medicines only as told by your doctor. Follow directions carefully.  Do not skip doses of blood pressure medicine. The medicine does not work as well if you skip doses. Skipping doses also puts you at risk for problems.  Ask your doctor about side effects or reactions to medicines that you should watch for. Contact a doctor if you:  Think you are having a reaction to the medicine you are taking.  Have headaches that keep coming back (recurring).  Feel dizzy.  Have swelling in your ankles.  Have trouble with your vision. Get help right away if you:  Get a very bad headache.  Start to feel mixed up (confused).  Feel weak or numb.  Feel faint.  Have very bad pain in your: ? Chest. ? Belly (abdomen).  Throw up more than once.  Have trouble breathing. Summary  Hypertension is another name for high blood pressure.  High blood pressure forces your heart to work harder to pump blood.  For most people, a normal blood pressure is less than 120/80.  Making healthy choices can help lower blood pressure. If your blood pressure does not get lower with healthy choices, you may need to take medicine. This information is not intended to replace advice given to you by your health care provider. Make sure you discuss any questions you have with your health care provider. Document Revised: 12/31/2017 Document Reviewed: 12/31/2017 Elsevier Patient Education  2020 [employer].

## 2019-11-23 NOTE — Progress Notes (Signed)
C /o LU Q abd pain X 1 wk  Last stool yesterday / little hard

## 2019-12-20 NOTE — Progress Notes (Unsigned)
S:    Patient arrives in no acute distress. Presents to the clinic for hypertension evaluation, counseling, and management. Patient was referred and last seen by NP on behalf of PCP on 11/23/19. At that visit, pt was started on amlodipine for newly diagnosed HTN.   Current BP Medications include:   1. Amlodipine 5 mg daily  Adverse Effects:  - {Actions; denies-reports:120008} {Blank:19196::"dizziness/lightheadedness","headaches","chest pain","shortness of breath","blurred vision"}  - DHP-CCB: {Blank multiple:19196::"peripheral edema","flushing","palpitations","rapid HR","gingival hyperplasia"}  Adherence:  Antihypertensives tried in the past include: none  Dietary: ***  - Sodium: *** goal < 1500 mg/day  - Caffeine: *** Exercise: *** Tobacco use: never smoker Alcohol: *** Stress: *** Sleep: *** NSAID use: *** Family / Social history: ***  ASCVD risk factors include:***  Prior to visit: - BP meds taken today: {YES/NO:21197} - Caffeine use: {YES/NO:21197} - Tobacco use: {YES/NO:21197} - Exercise: {YES/NO:21197}  O:  Home BP readings: ***  There were no vitals filed for this visit.  BMET    Component Value Date/Time   NA 139 07/15/2019 1121   K 5.2 07/15/2019 1121   CL 107 (H) 07/15/2019 1121   CO2 21 07/15/2019 1121   GLUCOSE 79 07/15/2019 1121   GLUCOSE 66 09/26/2015 1140   BUN 9 07/15/2019 1121   CREATININE 0.64 07/15/2019 1121   CREATININE 0.63 06/02/2014 1723   CALCIUM 9.3 07/15/2019 1121   GFRNONAA 110 07/15/2019 1121   GFRNONAA >89 06/02/2014 1723   GFRAA 126 07/15/2019 1121   GFRAA >89 06/02/2014 1723    Renal function: CrCl cannot be calculated (Patient's most recent lab result is older than the maximum 21 days allowed.).  Clinical ASCVD:  The ASCVD Risk score Denman George DC Jr., et al., 2013) failed to calculate for the following reasons:   Cannot find a previous HDL lab   Cannot find a previous total cholesterol lab   A: - Clinic BP not at goal of <  *** - Adherent to current medications, tolerating well  P: - Continue/start/stop*** - F/u labs ordered: *** - Lifestyle ***  Total time in face-to-face counseling *** minutes.   F/U Pharmacist Clinic Visit in ***.  Patient seen with ***

## 2019-12-21 ENCOUNTER — Ambulatory Visit: Payer: Medicaid Other | Admitting: Pharmacist

## 2019-12-26 ENCOUNTER — Other Ambulatory Visit: Payer: Self-pay | Admitting: Family

## 2019-12-26 DIAGNOSIS — I10 Essential (primary) hypertension: Secondary | ICD-10-CM

## 2020-11-22 ENCOUNTER — Encounter (HOSPITAL_COMMUNITY): Payer: Self-pay | Admitting: Emergency Medicine

## 2020-11-22 ENCOUNTER — Ambulatory Visit (HOSPITAL_COMMUNITY)
Admission: EM | Admit: 2020-11-22 | Discharge: 2020-11-22 | Disposition: A | Discharge status: Home / Self Care | Payer: Medicaid Other | Attending: Physician Assistant | Admitting: Physician Assistant

## 2020-11-22 ENCOUNTER — Other Ambulatory Visit: Payer: Self-pay

## 2020-11-22 DIAGNOSIS — R030 Elevated blood-pressure reading, without diagnosis of hypertension: Secondary | ICD-10-CM | POA: Insufficient documentation

## 2020-11-22 DIAGNOSIS — R059 Cough, unspecified: Secondary | ICD-10-CM | POA: Insufficient documentation

## 2020-11-22 DIAGNOSIS — Z79899 Other long term (current) drug therapy: Secondary | ICD-10-CM | POA: Diagnosis not present

## 2020-11-22 DIAGNOSIS — J069 Acute upper respiratory infection, unspecified: Secondary | ICD-10-CM

## 2020-11-22 DIAGNOSIS — U071 COVID-19: Secondary | ICD-10-CM | POA: Diagnosis not present

## 2020-11-22 LAB — POC INFLUENZA A AND B ANTIGEN (URGENT CARE ONLY)
INFLUENZA A ANTIGEN, POC: NEGATIVE
INFLUENZA B ANTIGEN, POC: NEGATIVE

## 2020-11-22 LAB — SARS CORONAVIRUS 2 (TAT 6-24 HRS): SARS Coronavirus 2: POSITIVE — AB

## 2020-11-22 MED ORDER — BENZONATATE 200 MG PO CAPS: 200.0000 mg | ORAL_CAPSULE | Freq: Three times a day (TID) | ORAL | 0 refills | Status: AC | PRN

## 2020-11-22 NOTE — ED Provider Notes (Signed)
[facility]    CSN: [id number] Arrival date & time: 11/22/20  1437      History   Chief Complaint Chief Complaint  Patient presents with   Cough   Headache    HPI Hailey Chambers is a 45 y.o. female.   Patient presents today with a 2-day history of URI symptoms.  She is [place] speaking and video interpreter used was utilized during this visit.  She reports cough, headache, fever with highest recorded 102 F, nasal congestion.  Denies any chest pain, shortness of breath, nausea, vomiting, body aches.  She has been taking ibuprofen without improvement of symptoms.  She denies any known sick contacts.  She is up-to-date on COVID-19 vaccination but has not had booster.  She has not had flu shot this year.  She denies history of allergies, asthma, smoking, COPD.  She has missed work as a result of symptoms and is requesting work excuse note today.   Past Medical History:  Diagnosis Date   Positive H. pylori test 06/03/2014    Patient Active Problem List   Diagnosis Date Noted   Active labor at term 11/03/2015   Anemia affecting pregnancy, antepartum 08/31/2015   Language barrier 08/24/2015   Fetal polydactyly affecting antepartum care of mother 08/24/2015   Supervision of normal pregnancy 05/31/2015   AMA (advanced maternal age) multigravida 35+ 05/31/2015   Sickle cell trait (HCC) 04/25/2015   Elevated BP 09/22/2014   Abdominal pain, chronic, epigastric 06/02/2014    Past Surgical History:  Procedure Laterality Date   NO PAST SURGERIES      OB History     Gravida  5   Para  5   Term  5   Preterm  0   AB  0   Living  4      SAB  0   IAB  0   Ectopic  0   Multiple  0   Live Births  4            Home Medications    Prior to Admission medications   Medication Sig Start Date End Date Taking? Authorizing Provider  benzonatate (TESSALON) 200 MG capsule Take 1 capsule (200 mg total) by mouth 3 (three) times daily as needed for cough.  11/22/20  Yes Neyla Gauntt, Noberto Retort, PA-C    Family History Family History  Problem Relation Age of Onset   Hypertension Mother    Diabetes Neg Hx    Cancer Neg Hx    Heart disease Neg Hx     Social History Social History   Tobacco Use   Smoking status: Never   Smokeless tobacco: Never  Substance Use Topics   Alcohol use: No    Alcohol/week: 0.0 standard drinks   Drug use: No     Allergies   Patient has no known allergies.   Review of Systems Review of Systems  Constitutional:  Positive for activity change, fatigue and fever. Negative for appetite change.  HENT:  Positive for congestion. Negative for sinus pressure, sneezing and sore throat.   Respiratory:  Positive for cough. Negative for shortness of breath.   Cardiovascular:  Negative for chest pain.  Gastrointestinal:  Negative for abdominal pain, diarrhea, nausea and vomiting.  Musculoskeletal:  Negative for arthralgias and myalgias.  Neurological:  Positive for headaches. Negative for dizziness and light-headedness.    Physical Exam Triage Vital Signs ED Triage Vitals  Enc Vitals Group     BP 11/22/20 1546 (!) 160/88  Pulse Rate 11/22/20 1546 (!) 53     Resp 11/22/20 1546 15     Temp 11/22/20 1546 97.8 F (36.6 C)     Temp Source 11/22/20 1546 Oral     SpO2 11/22/20 1546 100 %     Weight --      Height --      Head Circumference --      Peak Flow --      Pain Score 11/22/20 1543 8     Pain Loc --      Pain Edu? --      Excl. in GC? --    No data found.  Updated Vital Signs BP (!) 160/88 (BP Location: Right Arm)   Pulse (!) 53   Temp 97.8 F (36.6 C) (Oral)   Resp 15   LMP 11/09/2020   SpO2 100%   Visual Acuity Right Eye Distance:   Left Eye Distance:   Bilateral Distance:    Right Eye Near:   Left Eye Near:    Bilateral Near:     Physical Exam Vitals reviewed.  Constitutional:      General: She is awake. She is not in acute distress.    Appearance: Normal appearance. She is normal  weight. She is not ill-appearing.     Comments: Very pleasant female appears stated age in no acute distress sitting comfortably in exam room  HENT:     Head: Normocephalic and atraumatic.     Right Ear: External ear normal. There is impacted cerumen.     Left Ear: Tympanic membrane, ear canal and external ear normal. Tympanic membrane is not erythematous or bulging.     Nose:     Right Sinus: No maxillary sinus tenderness or frontal sinus tenderness.     Left Sinus: No maxillary sinus tenderness or frontal sinus tenderness.     Mouth/Throat:     Pharynx: Uvula midline. No oropharyngeal exudate or posterior oropharyngeal erythema.  Cardiovascular:     Rate and Rhythm: Normal rate and regular rhythm.     Heart sounds: Normal heart sounds, S1 normal and S2 normal. No murmur heard. Pulmonary:     Effort: Pulmonary effort is normal.     Breath sounds: Normal breath sounds. No wheezing, rhonchi or rales.     Comments: Clear to auscultation bilaterally Musculoskeletal:     Cervical back: Normal range of motion and neck supple.  Lymphadenopathy:     Head:     Right side of head: No submental, submandibular or tonsillar adenopathy.     Left side of head: No submental, submandibular or tonsillar adenopathy.     Cervical: No cervical adenopathy.  Psychiatric:        Behavior: Behavior is cooperative.     UC Treatments / Results  Labs (all labs ordered are listed, but only abnormal results are displayed) Labs Reviewed  SARS CORONAVIRUS 2 (TAT 6-24 HRS)  POC INFLUENZA A AND B ANTIGEN (URGENT CARE ONLY)    EKG   Radiology No results found.  Procedures Procedures (including critical care time)  Medications Ordered in UC Medications - No data to display  Initial Impression / Assessment and Plan / UC Course  I have reviewed the triage vital signs and the nursing notes.  Pertinent labs & imaging results that were available during my care of the patient were reviewed by me and  considered in my medical decision making (see chart for details).      Flu testing was negative  in clinic today.  COVID-19 test is pending.  Suspect viral etiology given short duration of symptoms.  Patient was prescribed Tessalon to be used up to 3 times a day as needed.  Recommended she use over-the-counter medications including Tylenol, Flonase, Mucinex for symptom relief.  Recommend she rest and drink plenty of fluid until symptoms resolve.  If anything worsens she is to return for reevaluation.  Strict return precautions given to which patient expressed understanding.  Patient's blood pressure is noted to be elevated today.  Recommended that she avoid decongestants, sodium, caffeine, NSAIDs.  She is to monitor her blood pressure at home and if this remains above 140/90 she is to return for reevaluation and medication management.  Recommended she follow-up with our clinic or PCP within 1 week for blood pressure recheck.  Discussed alarm symptoms that warrant emergent evaluation.  Strict return precautions given to patient expressed understanding.  Final Clinical Impressions(s) / UC Diagnoses   Final diagnoses:  Viral URI with cough  Cough  Elevated blood pressure reading     Discharge Instructions      Your flu is negative.  We will contact you if COVID-19 test is positive.  Alternate Tylenol ibuprofen for pain relief.  Use Tessalon for cough.  I recommend you use Flonase and Mucinex for symptom relief.  If anything worsens please return for reevaluation.  Your blood pressure was elevated today.  Please monitor this at home and if this is consistently above 140/90 please return for reevaluation.  Follow-up with primary care provider within 1 week for blood pressure recheck.  Avoid decongestants, NSAIDs, sodium, caffeine.  If you develop any chest pain, shortness of breath, headache, dizziness with elevated blood pressure you need to go to the emergency room.     ED Prescriptions      Medication Sig Dispense Auth. Provider   benzonatate (TESSALON) 200 MG capsule Take 1 capsule (200 mg total) by mouth 3 (three) times daily as needed for cough. 20 capsule Zayda Angell, Noberto Retort, PA-C      PDMP not reviewed this encounter.   Jeani Hawking, PA-C 11/22/20 1704

## 2020-11-22 NOTE — ED Triage Notes (Signed)
Patient c/o non productive cough and headache x 2 days.   Patient endorses fever of 102 F at home.    Patient denies any SOB or Chest Pain.   Patient has taken Ibuprofen with some relief of fever.   Patient speaks S[place].

## 2020-11-22 NOTE — Discharge Instructions (Addendum)
Your flu is negative.  We will contact you if COVID-19 test is positive.  Alternate Tylenol ibuprofen for pain relief.  Use Tessalon for cough.  I recommend you use Flonase and Mucinex for symptom relief.  If anything worsens please return for reevaluation.  Your blood pressure was elevated today.  Please monitor this at home and if this is consistently above 140/90 please return for reevaluation.  Follow-up with primary care provider within 1 week for blood pressure recheck.  Avoid decongestants, NSAIDs, sodium, caffeine.  If you develop any chest pain, shortness of breath, headache, dizziness with elevated blood pressure you need to go to the emergency room.

## 2024-01-27 ENCOUNTER — Encounter (HOSPITAL_COMMUNITY): Payer: Self-pay

## 2024-01-27 ENCOUNTER — Emergency Department (HOSPITAL_COMMUNITY)
Admission: EM | Admit: 2024-01-27 | Discharge: 2024-01-28 | Disposition: A | Discharge status: Home / Self Care | Attending: Emergency Medicine | Admitting: Emergency Medicine

## 2024-01-27 ENCOUNTER — Ambulatory Visit (HOSPITAL_COMMUNITY): Admission: EM | Admit: 2024-01-27 | Discharge: 2024-01-27 | Disposition: A | Discharge status: Home / Self Care

## 2024-01-27 ENCOUNTER — Emergency Department (HOSPITAL_COMMUNITY)

## 2024-01-27 ENCOUNTER — Encounter (HOSPITAL_COMMUNITY): Payer: Self-pay | Admitting: *Deleted

## 2024-01-27 ENCOUNTER — Other Ambulatory Visit: Payer: Self-pay

## 2024-01-27 DIAGNOSIS — I161 Hypertensive emergency: Secondary | ICD-10-CM

## 2024-01-27 DIAGNOSIS — I10 Essential (primary) hypertension: Secondary | ICD-10-CM | POA: Insufficient documentation

## 2024-01-27 DIAGNOSIS — G44201 Tension-type headache, unspecified, intractable: Secondary | ICD-10-CM | POA: Diagnosis not present

## 2024-01-27 DIAGNOSIS — R42 Dizziness and giddiness: Secondary | ICD-10-CM | POA: Diagnosis not present

## 2024-01-27 DIAGNOSIS — R519 Headache, unspecified: Secondary | ICD-10-CM | POA: Diagnosis present

## 2024-01-27 LAB — CBC WITH DIFFERENTIAL/PLATELET
Abs Immature Granulocytes: 0.01 K/uL (ref 0.00–0.07)
Basophils Absolute: 0 K/uL (ref 0.0–0.1)
Basophils Relative: 1 %
Eosinophils Absolute: 0.1 K/uL (ref 0.0–0.5)
Eosinophils Relative: 2 %
HCT: 31.6 % — ABNORMAL LOW (ref 36.0–46.0)
Hemoglobin: 9.9 g/dL — ABNORMAL LOW (ref 12.0–15.0)
Immature Granulocytes: 0 %
Lymphocytes Relative: 32 %
Lymphs Abs: 1.9 K/uL (ref 0.7–4.0)
MCH: 22.8 pg — ABNORMAL LOW (ref 26.0–34.0)
MCHC: 31.3 g/dL (ref 30.0–36.0)
MCV: 72.6 fL — ABNORMAL LOW (ref 80.0–100.0)
Monocytes Absolute: 0.6 K/uL (ref 0.1–1.0)
Monocytes Relative: 11 %
Neutro Abs: 3.3 K/uL (ref 1.7–7.7)
Neutrophils Relative %: 54 %
Platelets: 203 K/uL (ref 150–400)
RBC: 4.35 MIL/uL (ref 3.87–5.11)
RDW: 17.6 % — ABNORMAL HIGH (ref 11.5–15.5)
WBC: 6 K/uL (ref 4.0–10.5)
nRBC: 0 % (ref 0.0–0.2)

## 2024-01-27 LAB — BASIC METABOLIC PANEL WITH GFR
Anion gap: 9 (ref 5–15)
BUN: 10 mg/dL (ref 6–20)
CO2: 20 mmol/L — ABNORMAL LOW (ref 22–32)
Calcium: 8.9 mg/dL (ref 8.9–10.3)
Chloride: 107 mmol/L (ref 98–111)
Creatinine, Ser: 0.69 mg/dL (ref 0.44–1.00)
GFR, Estimated: >60 mL/min (ref >60)
Glucose, Bld: 100 mg/dL — ABNORMAL HIGH (ref 70–99)
Potassium: 3.9 mmol/L (ref 3.5–5.1)
Sodium: 136 mmol/L (ref 135–145)

## 2024-01-27 LAB — URINALYSIS, ROUTINE W REFLEX MICROSCOPIC
Bilirubin Urine: NEGATIVE
Glucose, UA: NEGATIVE mg/dL
Hgb urine dipstick: NEGATIVE
Ketones, ur: NEGATIVE mg/dL
Leukocytes,Ua: NEGATIVE
Nitrite: NEGATIVE
Protein, ur: NEGATIVE mg/dL
Specific Gravity, Urine: 1.013 (ref 1.005–1.030)
pH: 5 (ref 5.0–8.0)

## 2024-01-27 LAB — PREGNANCY, URINE: Preg Test, Ur: NEGATIVE

## 2024-01-27 NOTE — ED Triage Notes (Signed)
 PT reports for 3 days she has had a HA and has felt fatigued. PT has no known sick contacts. Pt reports she often has HA.  Pt has taken OTC for HA with no relief. Pt took ibuprofen  at 0600 this AM. Pt took 400 mg .

## 2024-01-27 NOTE — ED Provider Notes (Signed)
 [facility]    CSN: [id number] Arrival date & time: 01/27/24  1057      History   Chief Complaint Chief Complaint  Patient presents with   Headache   Fatigue    HPI Hailey Chambers is a 48 y.o. female presenting with headache x3 days. Describes as throbbing pain on the forehead. Rates the pain as 8/10. Has attempted ibuprofen  without relief.The last time she had a headache like this was years ago. Endorses dizziness.   She is [place] speaking. Spoke with patient using language line.  HPI  Past Medical History:  Diagnosis Date   Positive H. pylori test 06/03/2014    Patient Active Problem List   Diagnosis Date Noted   Active labor at term 11/03/2015   Anemia affecting pregnancy, antepartum 08/31/2015   Language barrier 08/24/2015   Fetal polydactyly affecting antepartum care of mother 08/24/2015   Supervision of normal pregnancy 05/31/2015   AMA (advanced maternal age) multigravida 35+ 05/31/2015   Sickle cell trait 04/25/2015   Elevated BP 09/22/2014   Abdominal pain, chronic, epigastric 06/02/2014    Past Surgical History:  Procedure Laterality Date   NO PAST SURGERIES      OB History     Gravida  5   Para  5   Term  5   Preterm  0   AB  0   Living  4      SAB  0   IAB  0   Ectopic  0   Multiple  0   Live Births  4            Home Medications    Prior to Admission medications   Medication Sig Start Date End Date Taking? Authorizing Provider  benzonatate  (TESSALON ) 200 MG capsule Take 1 capsule (200 mg total) by mouth 3 (three) times daily as needed for cough. 11/22/20   Raspet, Rocky POUR, PA-C    Family History Family History  Problem Relation Age of Onset   Hypertension Mother    Diabetes Neg Hx    Cancer Neg Hx    Heart disease Neg Hx     Social History Social History   Tobacco Use   Smoking status: Never   Smokeless tobacco: Never  Substance Use Topics   Alcohol use: No    Alcohol/week: 0.0 standard drinks  of alcohol   Drug use: No     Allergies   Patient has no known allergies.   Review of Systems Review of Systems  Neurological:  Positive for dizziness and headaches.     Physical Exam Triage Vital Signs ED Triage Vitals  Encounter Vitals Group     BP 01/27/24 1130 (!) 180/78     Girls Systolic BP Percentile --      Girls Diastolic BP Percentile --      Boys Systolic BP Percentile --      Boys Diastolic BP Percentile --      Pulse Rate 01/27/24 1130 (!) 58     Resp 01/27/24 1130 18     Temp 01/27/24 1130 97.8 F (36.6 C)     Temp src --      SpO2 01/27/24 1130 98 %     Weight --      Height --      Head Circumference --      Peak Flow --      Pain Score 01/27/24 1125 6     Pain Loc --  Pain Education --      Exclude from Growth Chart --    No data found.  Updated Vital Signs BP (!) 180/78   Pulse (!) 58   Temp 97.8 F (36.6 C)   Resp 18   LMP 01/21/2024 (Approximate)   SpO2 98%   Physical Exam Vitals reviewed.  Constitutional:      General: She is not in acute distress.    Appearance: Normal appearance. She is not ill-appearing.  HENT:     Head: Normocephalic and atraumatic.  Eyes:     Extraocular Movements: Extraocular movements intact.     Pupils: Pupils are equal, round, and reactive to light.  Cardiovascular:     Rate and Rhythm: Normal rate and regular rhythm.     Heart sounds: Normal heart sounds.  Pulmonary:     Effort: Pulmonary effort is normal.     Breath sounds: Normal breath sounds. No wheezing, rhonchi or rales.  Musculoskeletal:     Cervical back: Normal range of motion and neck supple. No rigidity.  Lymphadenopathy:     Cervical: No cervical adenopathy.  Skin:    Capillary Refill: Capillary refill takes less than 2 seconds.  Neurological:     General: No focal deficit present.     Mental Status: She is alert and oriented to person, place, and time. Mental status is at baseline.     Cranial Nerves: No cranial nerve deficit or  facial asymmetry.     Sensory: Sensation is intact. No sensory deficit.     Motor: Motor function is intact. No weakness.     Coordination: Coordination is intact. Coordination normal.     Gait: Gait is intact. Gait normal.     Comments: CN 2-12 intact. No weakness or numbness in UEs or LEs.  Psychiatric:        Mood and Affect: Mood normal.        Behavior: Behavior normal.        Thought Content: Thought content normal.        Judgment: Judgment normal.      UC Treatments / Results  Labs (all labs ordered are listed, but only abnormal results are displayed) Labs Reviewed - No data to display  EKG   Radiology No results found.  Procedures Procedures (including critical care time)  Medications Ordered in UC Medications - No data to display  Initial Impression / Assessment and Plan / UC Course  I have reviewed the triage vital signs and the nursing notes.  Pertinent labs & imaging results that were available during my care of the patient were reviewed by me and considered in my medical decision making (see chart for details).     Patient presenting with hypertensive emergency, severe headache, dizziness.  Symptoms present for 3 days, not relieved by over-the-counter pain medication.  Discussed that with her blood pressure and symptoms of headache and dizziness, she is at risk of end organ damage, including stroke.  Patient and I are in agreement that she will present to the emergency department. Stable to transfer by POV.  Final Clinical Impressions(s) / UC Diagnoses   Final diagnoses:  Hypertensive emergency  Acute intractable tension-type headache  Dizziness and giddiness     Discharge Instructions      -You have a severe headache and very high blood pressure. You need to head to the emergency department so that they can lower your blood pressure and make sure that you don't develop other issues like stroke  ED Prescriptions   None    PDMP not  reviewed this encounter.   Arlyss Leita BRAVO, PA-C 01/27/24 1159

## 2024-01-27 NOTE — Discharge Instructions (Addendum)
-  You have a severe headache and very high blood pressure. You need to head to the emergency department so that they can lower your blood pressure and make sure that you don't develop other issues like stroke

## 2024-01-27 NOTE — ED Triage Notes (Signed)
 See Sort RN note.  Pt reports she has never been diagnosed w/ HTN before.  Headache 7/10.

## 2024-01-27 NOTE — ED Provider Triage Note (Signed)
 Emergency Medicine Provider Triage Evaluation Note  Hailey Chambers , a 48 y.o. female  was evaluated in triage.  Pt complains of headache and dizziness x 3 days.  Patient describes frontal headache that is throbbing in nature.  Also dizziness described as a feeling like she wants to pass out.  Patient was seen at a clinic this morning, sent because her blood pressure was markedly elevated.  She reports no history of high blood pressure.  Patient speaks Swahili, video medical interpreter needed and utilized.  Review of Systems  Positive: Headache, dizziness Negative: Confusion  Physical Exam  BP (!) 206/102   Pulse 64   Temp 98.1 F (36.7 C)   Resp 16   Ht 4' 11 (1.499 m)   LMP 01/21/2024 (Approximate)   SpO2 100%   BMI 27.67 kg/m  Gen:   Awake, no distress   Resp:  Normal effort  MSK:   Moves extremities without difficulty  Other:  Ambulates normal.  Medical Decision Making  Medically screening exam initiated at 12:42 PM.  Appropriate orders placed.  Tyniya Pepperman was informed that the remainder of the evaluation will be completed by another provider, this initial triage assessment does not replace that evaluation, and the importance of remaining in the ED until their evaluation is complete.     Desiderio Chew, PA-C 01/27/24 1250

## 2024-01-27 NOTE — ED Triage Notes (Signed)
 Patient arrives ambulatory by POV sent by UC for HTN (180/78) headache and dizziness x 3 days. Patient speaks Swahili.

## 2024-01-28 ENCOUNTER — Telehealth: Payer: Self-pay

## 2024-01-28 MED ORDER — AMLODIPINE BESYLATE 5 MG PO TABS: 5.0000 mg | ORAL_TABLET | Freq: Every day | ORAL | 0 refills | Status: AC

## 2024-01-28 MED ORDER — AMLODIPINE BESYLATE 5 MG PO TABS
5.0000 mg | ORAL_TABLET | Freq: Once | ORAL | Status: AC
  Administered 2024-01-28: 5 mg via ORAL
  Filled 2024-01-28: qty 1

## 2024-01-28 MED ORDER — ONDANSETRON 4 MG PO TBDP
4.0000 mg | ORAL_TABLET | Freq: Once | ORAL | Status: AC
  Administered 2024-01-28: 4 mg via ORAL
  Filled 2024-01-28: qty 1

## 2024-01-28 MED ORDER — KETOROLAC TROMETHAMINE 15 MG/ML IJ SOLN
15.0000 mg | Freq: Once | INTRAMUSCULAR | Status: AC
  Administered 2024-01-28: 15 mg via INTRAMUSCULAR
  Filled 2024-01-28: qty 1

## 2024-01-28 NOTE — ED Provider Notes (Addendum)
[place] EMERGENCY DEPARTMENT AT [facility] Provider Note   CSN: [id number] Arrival date & time: 01/27/24  1209     Patient presents with: Headache and Dizziness   Hailey Chambers is a 48 y.o. female.  Presents today for headache and elevated blood pressure x 3 days.  Patient denies chest pain, shortness of breath, nausea, vomiting, fever, chills, diplopia, neck pain, or tinnitus.  Certified language interpreter used to gather HPI.     Headache Dizziness Associated symptoms: headaches        Prior to Admission medications   Medication Sig Start Date End Date Taking? Authorizing Provider  amLODipine  (NORVASC ) 5 MG tablet Take 1 tablet (5 mg total) by mouth daily. 01/28/24 02/27/24 Yes Bary Limbach N, PA-C  benzonatate  (TESSALON ) 200 MG capsule Take 1 capsule (200 mg total) by mouth 3 (three) times daily as needed for cough. 11/22/20   Raspet, Erin K, PA-C    Allergies: Patient has no known allergies.    Review of Systems  Neurological:  Positive for headaches.    Updated Vital Signs BP (!) 156/65   Pulse (!) 47   Temp 98.5 F (36.9 C) (Oral)   Resp 14   Ht 4' 11 (1.499 m)   LMP 01/21/2024 (Approximate)   SpO2 100%   BMI 27.67 kg/m   Physical Exam Vitals and nursing note reviewed.  Constitutional:      General: She is not in acute distress.    Appearance: She is well-developed. She is not ill-appearing.  HENT:     Head: Normocephalic and atraumatic.     Mouth/Throat:     Mouth: Mucous membranes are moist.  Eyes:     Extraocular Movements: Extraocular movements intact.     Conjunctiva/sclera: Conjunctivae normal.     Pupils: Pupils are equal, round, and reactive to light.  Cardiovascular:     Rate and Rhythm: Normal rate and regular rhythm.     Heart sounds: Normal heart sounds. No murmur heard. Pulmonary:     Effort: Pulmonary effort is normal. No respiratory distress.     Breath sounds: Normal breath sounds.  Abdominal:     Palpations:  Abdomen is soft.     Tenderness: There is no abdominal tenderness.  Musculoskeletal:        General: No swelling.     Cervical back: Normal range of motion and neck supple. No rigidity.  Skin:    General: Skin is warm and dry.     Capillary Refill: Capillary refill takes less than 2 seconds.  Neurological:     Mental Status: She is alert.     GCS: GCS eye subscore is 4. GCS verbal subscore is 5. GCS motor subscore is 6.     Cranial Nerves: No cranial nerve deficit or facial asymmetry.     Sensory: No sensory deficit.     Motor: No weakness.  Psychiatric:        Mood and Affect: Mood normal.        Speech: Speech normal.        Behavior: Behavior normal.     (all labs ordered are listed, but only abnormal results are displayed) Labs Reviewed  CBC WITH DIFFERENTIAL/PLATELET - Abnormal; Notable for the following components:      Result Value   Hemoglobin 9.9 (*)    HCT 31.6 (*)    MCV 72.6 (*)    MCH 22.8 (*)    RDW 17.6 (*)    All other components within  normal limits  BASIC METABOLIC PANEL WITH GFR - Abnormal; Notable for the following components:   CO2 20 (*)    Glucose, Bld 100 (*)    All other components within normal limits  PREGNANCY, URINE  URINALYSIS, ROUTINE W REFLEX MICROSCOPIC    EKG: None  Radiology: CT Head Wo Contrast Result Date: 01/27/2024 CLINICAL DATA:  Newly diagnosed hypertension, presenting with worsening frontal headache and dizziness x3 days. EXAM: CT HEAD WITHOUT CONTRAST TECHNIQUE: Contiguous axial images were obtained from the base of the skull through the vertex without intravenous contrast. RADIATION DOSE REDUCTION: This exam was performed according to the departmental dose-optimization program which includes automated exposure control, adjustment of the mA and/or kV according to patient size and/or use of iterative reconstruction technique. COMPARISON:  None Available. FINDINGS: Brain: No evidence of acute infarction, hemorrhage, hydrocephalus,  extra-axial collection or mass lesion/mass effect. Vascular: No hyperdense vessel or unexpected calcification. Skull: Normal. Negative for fracture or focal lesion. Sinuses/Orbits: No acute finding. Other: None. IMPRESSION: No acute intracranial pathology. Electronically Signed   By: Suzen Dials M.D.   On: 01/27/2024 13:34     Procedures   Medications Ordered in the ED  amLODipine  (NORVASC ) tablet 5 mg (5 mg Oral Given 01/28/24 0129)  ketorolac  (TORADOL ) 15 MG/ML injection 15 mg (15 mg Intramuscular Given 01/28/24 0128)  ondansetron  (ZOFRAN -ODT) disintegrating tablet 4 mg (4 mg Oral Given 01/28/24 0129)                                    Medical Decision Making Risk Prescription drug management.   This patient presents to the ED for concern of headache and elevated blood pressure, this involves an extensive number of treatment options, and is a complaint that carries with it a high risk of complications and morbidity.  The differential diagnosis includes hypertension, hypertensive emergency, brain bleed   Lab Tests:  I Ordered, and personally interpreted labs.  The pertinent results include: UA unremarkable, negative pregnancy, anemia 9.9, mildly reduced CO2 at 20   Imaging Studies ordered:  I ordered imaging studies including CT head Noncon I independently visualized and interpreted imaging which showed no acute intracranial pathology I agree with the radiologist interpretation   Problem List / ED Course / Critical interventions / Medication management I ordered medication including amlodipine , Zofran , Toradol  I have reviewed the patients home medicines and have made adjustments as needed   Test / Admission - Considered:  Considered for admission or further workup however patient's vital signs, physical exam, labs, and imaging are reassuring.  Patient given headache cocktail and amlodipine  while in ED.  Patient started on amlodipine  outpatient and to follow-up with care  management given that she does not have a primary care provider.  Patient given return precautions.  I feel patient safe for discharge at this time.     Final diagnoses:  Uncontrolled hypertension    ED Discharge Orders          Ordered    amLODipine  (NORVASC ) 5 MG tablet  Daily        01/28/24 0045    AMB Referral [facility] Care Management       Comments: New HTN   01/28/24 0045               Francis Ileana SAILOR, PA-C 01/28/24 0045    Francis Ileana SAILOR, PA-C 01/28/24 9862    Haze Lonni PARAS, MD 01/28/24  0654  

## 2024-01-28 NOTE — Progress Notes (Signed)
 Care Guide Pharmacy Note  01/28/2024 Name: Jenia Klepper MRN: [id number] DOB: August 11, 1975  Referred By: Pcp, No Reason for referral: Complex Care Management (Outreach to schedule with pharm d )   Jersee Winiarski is a 48 y.o. year old female who is a primary care patient of Pcp, No.  Raini Tiley was referred to the pharmacist for assistance related to: HTN  Successful contact was made with the patient to discuss pharmacy services including being ready for the pharmacist to call at least 5 minutes before the scheduled appointment time and to have medication bottles and any blood pressure readings ready for review. The patient agreed to meet with the pharmacist via telephone visit on (date/time).02/13/2024  Jeoffrey Buffalo , RMA     [place]  [facility], [facility] Guide  Direct Dial: [phone]  Website: [email or web address]

## 2024-01-28 NOTE — ED Notes (Signed)
 Discharge instructions reviewed.   Newly prescribed medications discussed. Pharmacy verified.   Opportunity for questions and concerns provided.   Alert, oriented and ambulatory.   Displays no signs of distress.

## 2024-01-28 NOTE — Discharge Instructions (Signed)

## 2024-02-13 ENCOUNTER — Telehealth: Payer: Self-pay

## 2024-02-13 ENCOUNTER — Other Ambulatory Visit: Payer: Self-pay

## 2024-02-13 NOTE — Telephone Encounter (Signed)
 Called to schedule an appt left messages for her to call us  back

## 2024-02-13 NOTE — Progress Notes (Signed)
   02/13/2024 Name: Hailey Chambers MRN: [id number] DOB: Jul 02, 1975  Chief Complaint  Patient presents with   Medication Management   Hypertension    Hailey Chambers is a 48 y.o. year old female who presented for a telephone visit.   They were referred to the pharmacist by an Emergency Department Provider Hailey Eck, PA-C) for assistance in managing hypertension.    Subjective:  Care Team: Primary Care Provider: Pcp, No ;   Medication Access/Adherence  Current Pharmacy:  [facility] [phone] - [place], [place] - [address] [place] [address] Phone: [phone] Fax: [phone]   Patient reports affordability concerns with their medications: No  Patient reports access/transportation concerns to their pharmacy: No  Patient reports adherence concerns with their medications:  No     Hypertension:  Current medications: Amlodipine  5mg  Medications previously tried: None  Patient does not have a validated, automated, upper arm home BP cuff Current blood pressure readings readings: not able to check  Patient denies hypotensive s/sx including dizziness, lightheadedness.  Patient reports hypertensive symptoms: headache nightly, taking OTC tylenol  or ibuprofen  for relief, reports this does work. Reports it is a frontal headache    Objective:  Lab Results  Component Value Date   HGBA1C 5.7 (H) 07/15/2019    Lab Results  Component Value Date   CREATININE 0.69 01/27/2024   BUN 10 01/27/2024   NA 136 01/27/2024   K 3.9 01/27/2024   CL 107 01/27/2024   CO2 20 (L) 01/27/2024    Lab Results  Component Value Date   CHOL 149 06/02/2014   HDL 61 06/02/2014   LDLCALC 58 06/02/2014   TRIG 150 (H) 06/02/2014   CHOLHDL 2.4 06/02/2014    Medications Reviewed Today     Reviewed by Hailey Chambers, RPH (Pharmacist) on 02/13/24 at 1122  Med List Status: <None>   Medication Order Taking? Sig  Documenting Provider Last Dose Status Informant  amLODipine  (NORVASC ) 5 MG tablet [id number]  Take 1 tablet (5 mg total) by mouth daily. Keith, Kayla N, PA-C  Active   benzonatate  (TESSALON ) 200 MG capsule [id number]  Take 1 capsule (200 mg total) by mouth 3 (three) times daily as needed for cough. Raspet, Rocky POUR, PA-C  Active               Assessment/Plan:   Hypertension: - Currently uncontrolled - Reviewed long term cardiovascular and renal outcomes of uncontrolled blood pressure - Reviewed appropriate blood pressure monitoring technique and reviewed goal blood pressure. Recommended to check home blood pressure and heart rate daily. Patient plans to get cuff this weekend - Recommend to continue current medication. Consider increase in amlodipine  at PCP visit and addition of second agent like ACEI/ARB or thiazide diuretic if still markedly elevated -Counseled to limit sodium/caffeine intake -Counseled on return ER precaustions -Patient consents to outreach from Hailey Chambers office for scheduling of PCP visit     Follow Up Plan: schedule with PCP, reach out with other questions/concerns  Hailey Chambers Hailey, PharmD Clinical Pharmacist [phone]

## 2024-06-01 ENCOUNTER — Ambulatory Visit (HOSPITAL_COMMUNITY)
Admission: EM | Admit: 2024-06-01 | Discharge: 2024-06-01 | Disposition: A | Discharge status: Home / Self Care | Attending: Emergency Medicine | Admitting: Emergency Medicine

## 2024-06-01 ENCOUNTER — Encounter (HOSPITAL_COMMUNITY): Payer: Self-pay

## 2024-06-01 DIAGNOSIS — R509 Fever, unspecified: Secondary | ICD-10-CM | POA: Diagnosis not present

## 2024-06-01 DIAGNOSIS — I10 Essential (primary) hypertension: Secondary | ICD-10-CM | POA: Diagnosis not present

## 2024-06-01 LAB — POCT INFLUENZA A/B
Influenza A, POC: NEGATIVE
Influenza B, POC: NEGATIVE

## 2024-06-01 LAB — POC SOFIA SARS ANTIGEN FIA: SARS Coronavirus 2 Ag: NEGATIVE

## 2024-06-01 NOTE — ED Provider Notes (Signed)
 " [facility]    CSN: [id number] Arrival date & time: 06/01/24  1544      History   Chief Complaint Chief Complaint  Patient presents with   Fever    HPI Hailey Chambers is a 49 y.o. female.   Patient presents to clinic over concern of a fever that started today at work.  They did check her temperature at work but did not tell her what it was.  She has not had medications or interventions prior to arrival.  She has not had cough, sore throat, body aches or chills.  Denies recent sick contacts.  Reports compliance with her blood pressure medication but has not seen her primary care provider.  The history is provided by the patient and medical records.  Fever   Past Medical History:  Diagnosis Date   Positive H. pylori test 06/03/2014    Patient Active Problem List   Diagnosis Date Noted   Active labor at term 11/03/2015   Anemia affecting pregnancy, antepartum 08/31/2015   Language barrier 08/24/2015   Fetal polydactyly affecting antepartum care of mother 08/24/2015   Supervision of normal pregnancy 05/31/2015   AMA (advanced maternal age) multigravida 35+ 05/31/2015   Sickle cell trait 04/25/2015   Elevated BP 09/22/2014   Abdominal pain, chronic, epigastric 06/02/2014    Past Surgical History:  Procedure Laterality Date   NO PAST SURGERIES      OB History     Gravida  5   Para  5   Term  5   Preterm  0   AB  0   Living  4      SAB  0   IAB  0   Ectopic  0   Multiple  0   Live Births  4            Home Medications    Prior to Admission medications  Medication Sig Start Date End Date Taking? Authorizing Provider  amLODipine  (NORVASC ) 5 MG tablet Take 1 tablet (5 mg total) by mouth daily. 01/28/24 02/27/24  Keith, Kayla N, PA-C  benzonatate  (TESSALON ) 200 MG capsule Take 1 capsule (200 mg total) by mouth 3 (three) times daily as needed for cough. 11/22/20   Raspet, Rocky POUR, PA-C    Family History Family History  Problem  Relation Age of Onset   Hypertension Mother    Diabetes Neg Hx    Cancer Neg Hx    Heart disease Neg Hx     Social History Social History[1]   Allergies   Patient has no known allergies.   Review of Systems Review of Systems  Per HPI  Physical Exam Triage Vital Signs ED Triage Vitals  Encounter Vitals Group     BP 06/01/24 1717 (!) 177/74     Girls Systolic BP Percentile --      Girls Diastolic BP Percentile --      Boys Systolic BP Percentile --      Boys Diastolic BP Percentile --      Pulse Rate 06/01/24 1716 69     Resp 06/01/24 1716 16     Temp 06/01/24 1716 98.6 F (37 C)     Temp src --      SpO2 06/01/24 1716 98 %     Weight --      Height --      Head Circumference --      Peak Flow --      Pain Score 06/01/24 1715  0     Pain Loc --      Pain Education --      Exclude from Growth Chart --    No data found.  Updated Vital Signs BP (!) 177/74   Pulse 69   Temp 98.6 F (37 C)   Resp 16   LMP  (LMP Unknown)   SpO2 98%   Visual Acuity Right Eye Distance:   Left Eye Distance:   Bilateral Distance:    Right Eye Near:   Left Eye Near:    Bilateral Near:     Physical Exam Vitals and nursing note reviewed.  Constitutional:      Appearance: Normal appearance.  HENT:     Head: Normocephalic and atraumatic.     Right Ear: External ear normal.     Left Ear: External ear normal.     Nose: Nose normal.     Mouth/Throat:     Mouth: Mucous membranes are moist.  Eyes:     Conjunctiva/sclera: Conjunctivae normal.  Cardiovascular:     Rate and Rhythm: Normal rate and regular rhythm.     Heart sounds: Normal heart sounds. No murmur heard. Pulmonary:     Effort: Pulmonary effort is normal. No respiratory distress.  Skin:    General: Skin is warm and dry.  Neurological:     General: No focal deficit present.     Mental Status: She is alert.  Psychiatric:        Mood and Affect: Mood normal.      UC Treatments / Results  Labs (all labs  ordered are listed, but only abnormal results are displayed) Labs Reviewed  POC SOFIA SARS ANTIGEN FIA  POCT INFLUENZA A/B    EKG   Radiology No results found.  Procedures Procedures (including critical care time)  Medications Ordered in UC Medications - No data to display  Initial Impression / Assessment and Plan / UC Course  I have reviewed the triage vital signs and the nursing notes.  Pertinent labs & imaging results that were available during my care of the patient were reviewed by me and considered in my medical decision making (see chart for details).  Was and triage reviewed, patient is hemodynamically stable.  Lungs vesicular, heart with regular rate and rhythm.  Afebrile here in clinic.  COVID and flu testing negative, suspect other viral etiology to fever.  Symptomatic management for fever discussed.  Blood pressure elevated, PCP follow-up for hypertension management.  Plan of care, follow-up care, and return precautions given, no questions at this time.  Work note provided.    Final Clinical Impressions(s) / UC Diagnoses   Final diagnoses:  Fever, unspecified  Essential hypertension     Discharge Instructions      For fever you can take 800 mg of ibuprofen  every 8 hours You tested negative for COVID and flu in clinic but may have a different viral illness Viral illnesses typically last 5 to 7 days.  You can return to work when 24 hours fever free If no improvement over the next week or any new concerning symptoms develop return to clinic for reevaluation  Your blood pressure was elevated today in clinic, please follow-up with a primary care provider listed on your Medicaid card for further management of your hypertension     ED Prescriptions   None    PDMP not reviewed this encounter.     [1]  Social History Tobacco Use   Smoking status: Never   Smokeless  tobacco: Never  Substance Use Topics   Alcohol use: No    Alcohol/week: 0.0  standard drinks of alcohol   Drug use: No     Mercer Lenor MATSU, FNP 06/01/24 1812  "

## 2024-06-01 NOTE — ED Triage Notes (Signed)
 Pt present with c/o fever that started today, denies other symptoms. Pt has not taken medicine at home for relief.

## 2024-06-01 NOTE — Discharge Instructions (Addendum)
 For fever you can take 800 mg of ibuprofen  every 8 hours You tested negative for COVID and flu in clinic but may have a different viral illness Viral illnesses typically last 5 to 7 days.  You can return to work when 24 hours fever free If no improvement over the next week or any new concerning symptoms develop return to clinic for reevaluation  Your blood pressure was elevated today in clinic, please follow-up with a primary care provider listed on your Medicaid card for further management of your hypertension
